# Patient Record
Sex: Male | Born: 1935 | Race: White | Hispanic: No | Marital: Married | Smoking: Never smoker
Health system: Southern US, Community
[De-identification: ages and names within clinical notes are randomized; demographics above are authoritative.]

## PROBLEM LIST (undated history)

## (undated) DIAGNOSIS — E079 Disorder of thyroid, unspecified: Secondary | ICD-10-CM

## (undated) DIAGNOSIS — E785 Hyperlipidemia, unspecified: Secondary | ICD-10-CM

## (undated) DIAGNOSIS — E119 Type 2 diabetes mellitus without complications: Secondary | ICD-10-CM

## (undated) HISTORY — DX: Type 2 diabetes mellitus without complications: E11.9

## (undated) HISTORY — DX: Hyperlipidemia, unspecified: E78.5

## (undated) HISTORY — DX: Disorder of thyroid, unspecified: E07.9

---

## 2005-05-20 ENCOUNTER — Ambulatory Visit: Payer: Self-pay | Admitting: Family Medicine

## 2005-06-12 ENCOUNTER — Ambulatory Visit: Payer: Self-pay | Admitting: Internal Medicine

## 2013-03-31 ENCOUNTER — Ambulatory Visit (INDEPENDENT_AMBULATORY_CARE_PROVIDER_SITE_OTHER): Payer: Medicare Other | Admitting: Family Medicine

## 2013-03-31 ENCOUNTER — Encounter: Payer: Self-pay | Admitting: Family Medicine

## 2013-03-31 VITALS — BP 152/76 | HR 70 | Temp 97.6°F | Ht 70.0 in | Wt 172.8 lb

## 2013-03-31 DIAGNOSIS — E119 Type 2 diabetes mellitus without complications: Secondary | ICD-10-CM

## 2013-03-31 DIAGNOSIS — E785 Hyperlipidemia, unspecified: Secondary | ICD-10-CM | POA: Insufficient documentation

## 2013-03-31 DIAGNOSIS — H9319 Tinnitus, unspecified ear: Secondary | ICD-10-CM | POA: Insufficient documentation

## 2013-03-31 DIAGNOSIS — H9313 Tinnitus, bilateral: Secondary | ICD-10-CM

## 2013-03-31 DIAGNOSIS — E039 Hypothyroidism, unspecified: Secondary | ICD-10-CM

## 2013-03-31 DIAGNOSIS — R350 Frequency of micturition: Secondary | ICD-10-CM

## 2013-03-31 DIAGNOSIS — N400 Enlarged prostate without lower urinary tract symptoms: Secondary | ICD-10-CM

## 2013-03-31 LAB — POCT URINALYSIS DIPSTICK
Bilirubin, UA: NEGATIVE
Blood, UA: NEGATIVE
Glucose, UA: NEGATIVE
Ketones, UA: NEGATIVE
Leukocytes, UA: NEGATIVE
Nitrite, UA: NEGATIVE
Protein, UA: NEGATIVE
Spec Grav, UA: 1.015
Urobilinogen, UA: NEGATIVE
pH, UA: 8

## 2013-03-31 MED ORDER — CIPROFLOXACIN HCL 500 MG PO TABS: 500.0000 mg | ORAL_TABLET | Freq: Two times a day (BID) | ORAL | Status: DC

## 2013-03-31 MED ORDER — TAMSULOSIN HCL 0.4 MG PO CAPS: 0.4000 mg | ORAL_CAPSULE | Freq: Every day | ORAL | Status: DC

## 2013-03-31 NOTE — Progress Notes (Signed)
  Subjective:    Patient ID: Arthur Franklin, male    DOB: 08/04/1932, 79 y.o.   MRN: [id number]  HPI This 79 y.o. male presents for evaluation of nocturia and urinary frequency. He states he has been Having to get up more at night to void.  He states he has been rx'd abx in the past and this helps. He has hx of BPH.  He was last seen in 2/14 and had labs then.  He denies fever.   Review of Systems C/o nocturia and tinnitus. No chest pain, SOB, HA, dizziness, vision change, N/V, diarrhea, constipation, myalgias, arthralgias or rash.     Objective:   Physical Exam Vital signs noted  Well developed well nourished male.  HEENT - Head atraumatic Normocephalic                Eyes - PERRLA, Conjuctiva - clear Sclera- Clear EOMI                Ears - EAC's Wnl TM's Wnl Gross Hearing WNL                Nose - Nares patent                 Throat - oropharanx wnl Respiratory - Lungs CTA bilateral Cardiac - RRR S1 and S2 without murmur GI - Abdomen soft Nontender and bowel sounds active x 4 Extremities - No edema. Neuro - Grossly intact.       Assessment & Plan:  Frequency of urination - Plan: POCT UA - Microscopic Only, POCT urinalysis dipstick, ciprofloxacin (CIPRO) 500 MG tablet, tamsulosin (FLOMAX) 0.4 MG CAPS capsule.  He may have some prostatitis so will tx with abx for 3 weeks.  BPH (benign prostatic hyperplasia) - Plan: ciprofloxacin (CIPRO) 500 MG tablet, tamsulosin (FLOMAX) 0.4 MG CAPS capsule  Tinnitus - Discussed that he needs to wear hearing protection if out around hazardous noise.  Follow up in 3 to 6 months.

## 2013-04-21 ENCOUNTER — Other Ambulatory Visit: Payer: Self-pay

## 2013-04-21 MED ORDER — METFORMIN HCL 500 MG PO TABS: 500.0000 mg | ORAL_TABLET | Freq: Every day | ORAL | Status: DC

## 2013-04-21 MED ORDER — LEVOTHYROXINE SODIUM 50 MCG PO TABS: 50.0000 ug | ORAL_TABLET | Freq: Every day | ORAL | Status: DC

## 2013-05-19 ENCOUNTER — Other Ambulatory Visit: Payer: Self-pay | Admitting: Family Medicine

## 2013-06-21 ENCOUNTER — Other Ambulatory Visit: Payer: Self-pay | Admitting: Family Medicine

## 2013-07-19 ENCOUNTER — Ambulatory Visit (INDEPENDENT_AMBULATORY_CARE_PROVIDER_SITE_OTHER): Payer: Medicare Other | Admitting: Family Medicine

## 2013-07-19 ENCOUNTER — Encounter: Payer: Self-pay | Admitting: Family Medicine

## 2013-07-19 VITALS — BP 139/69 | HR 73 | Temp 98.0°F | Ht 70.0 in | Wt 166.0 lb

## 2013-07-19 DIAGNOSIS — J209 Acute bronchitis, unspecified: Secondary | ICD-10-CM

## 2013-07-19 MED ORDER — BENZONATATE 100 MG PO CAPS: 100.0000 mg | ORAL_CAPSULE | Freq: Two times a day (BID) | ORAL | Status: DC | PRN

## 2013-07-19 MED ORDER — AZITHROMYCIN 250 MG PO TABS: ORAL_TABLET | ORAL | Status: DC

## 2013-07-19 NOTE — Progress Notes (Signed)
   Subjective:    Patient ID: Regnald Bowens, male    DOB: [birth year], 79 y.o.   MRN: [id number]  HPI   This 79 y.o. male presents for evaluation of URI sx's and cough for a week. He has also Been having constipation problems recently.. Review of Systems C/o URI sx's and constipation No chest pain, SOB, HA, dizziness, vision change, N/V, diarrhea, constipation, dysuria, urinary urgency or frequency, myalgias, arthralgias or rash.     Objective:   Physical Exam  Vital signs noted  Well developed well nourished male.  HEENT - Head atraumatic Normocephalic Ears - EAC's normal and TM's normal bilateral Respiratory - Lungs CTA bilateral Cardiac - RRR S1 and S2 without murmur GI - Abdomen soft Nontender and bowel sounds active x 4 Extremities - No edema. Neuro - Grossly intact.      Assessment & Plan:  Acute bronchitis - Plan: azithromycin (ZITHROMAX) 250 MG tablet, benzonatate (TESSALON) 100 MG capsule. Push po fluids, rest, tylenol and motrin otc prn as directed for fever, arthralgias, and myalgias.  Follow up prn if sx's continue or persist.  Constipation - Linzess 145 mcg po qd #28 samples  Deatra Canter FNP

## 2013-07-19 NOTE — Patient Instructions (Signed)

## 2013-08-17 ENCOUNTER — Other Ambulatory Visit: Payer: Self-pay | Admitting: Family Medicine

## 2013-08-22 NOTE — Telephone Encounter (Signed)
Last seen 07/19/13 B [place]  No glucose in EPIC

## 2013-09-20 ENCOUNTER — Other Ambulatory Visit: Payer: Self-pay | Admitting: Family Medicine

## 2013-10-03 ENCOUNTER — Ambulatory Visit: Payer: Medicare Other | Admitting: Family Medicine

## 2013-10-13 ENCOUNTER — Ambulatory Visit: Payer: Medicare Other | Admitting: Family Medicine

## 2014-01-23 ENCOUNTER — Encounter: Payer: Self-pay | Admitting: Family Medicine

## 2014-01-23 ENCOUNTER — Ambulatory Visit (INDEPENDENT_AMBULATORY_CARE_PROVIDER_SITE_OTHER): Payer: Medicare HMO | Admitting: Family Medicine

## 2014-01-23 VITALS — BP 137/73 | HR 59 | Temp 96.9°F | Ht 70.0 in | Wt 168.8 lb

## 2014-01-23 DIAGNOSIS — M549 Dorsalgia, unspecified: Secondary | ICD-10-CM

## 2014-01-23 DIAGNOSIS — N400 Enlarged prostate without lower urinary tract symptoms: Secondary | ICD-10-CM

## 2014-01-23 DIAGNOSIS — R350 Frequency of micturition: Secondary | ICD-10-CM

## 2014-01-23 LAB — POCT UA - MICROSCOPIC ONLY
Bacteria, U Microscopic: NEGATIVE
Casts, Ur, LPF, POC: NEGATIVE
Crystals, Ur, HPF, POC: NEGATIVE
Mucus, UA: NEGATIVE
Yeast, UA: NEGATIVE

## 2014-01-23 LAB — POCT URINALYSIS DIPSTICK
Bilirubin, UA: NEGATIVE
Blood, UA: NEGATIVE
Glucose, UA: NEGATIVE
Ketones, UA: NEGATIVE
Leukocytes, UA: NEGATIVE
Nitrite, UA: NEGATIVE
Spec Grav, UA: 1.01
Urobilinogen, UA: NEGATIVE
pH, UA: 7.5

## 2014-01-23 MED ORDER — CIPROFLOXACIN HCL 500 MG PO TABS: 500.0000 mg | ORAL_TABLET | Freq: Two times a day (BID) | ORAL | Status: DC

## 2014-01-23 NOTE — Progress Notes (Signed)
   Subjective:    Patient ID: Arthur Franklin, male    DOB: 04/18/1932, 80 y.o.   MRN: [id number]  HPI This 80 y.o. male presents for evaluation of urinary sx's and back discomfort.  He has hx of chronic prostatitis and states he feels like he does before getting on abx's and then the abx's help his urine flow.  He is on flomax.  He states he is having some left lumbar back discomfort.   Review of Systems C/o back pain and urinary sx's No chest pain, SOB, HA, dizziness, vision change, N/V, diarrhea, constipation,  myalgias, arthralgias or rash.     Objective:   Physical Exam Vital signs noted  Well developed well nourished male.  HEENT - Head atraumatic Normocephalic Respiratory - Lungs CTA bilateral Cardiac - RRR S1 and S2 without murmur GI - Abdomen soft Nontender and bowel sounds active x 4 Extremities - No edema. Neuro - Grossly intact. MS - TTP left LS muscle  Results for orders placed in visit on 01/23/14  POCT UA - MICROSCOPIC ONLY      Result Value Ref Range   WBC, Ur, HPF, POC 5-7     RBC, urine, microscopic 1-5     Bacteria, U Microscopic neg     Mucus, UA neg     Epithelial cells, urine per micros occ     Crystals, Ur, HPF, POC neg     Casts, Ur, LPF, POC neg     Yeast, UA neg    POCT URINALYSIS DIPSTICK      Result Value Ref Range   Color, UA gold     Clarity, UA clear     Glucose, UA neg     Bilirubin, UA neg     Ketones, UA neg     Spec Grav, UA 1.010     Blood, UA neg     pH, UA 7.5     Protein, UA trace     Urobilinogen, UA negative     Nitrite, UA neg     Leukocytes, UA Negative         Assessment & Plan:  Urinary frequency - Plan: POCT UA - Microscopic Only, POCT urinalysis dipstick, Urine culture  Back pain - Plan: Urine culture  Frequency of urination - Plan: ciprofloxacin (CIPRO) 500 MG tablet  BPH (benign prostatic hyperplasia) - Plan: ciprofloxacin (CIPRO) 500 MG tablet  Lysbeth Penner FNP

## 2014-01-25 LAB — URINE CULTURE

## 2014-06-06 ENCOUNTER — Ambulatory Visit (INDEPENDENT_AMBULATORY_CARE_PROVIDER_SITE_OTHER): Payer: Medicare HMO

## 2014-06-06 DIAGNOSIS — Z23 Encounter for immunization: Secondary | ICD-10-CM

## 2014-07-31 ENCOUNTER — Telehealth: Payer: Self-pay | Admitting: Family Medicine

## 2014-07-31 NOTE — Telephone Encounter (Signed)
Patient states he is going to go to the ED since he is in so much pain.

## 2014-09-30 ENCOUNTER — Other Ambulatory Visit: Payer: Self-pay | Admitting: Family Medicine

## 2014-10-31 ENCOUNTER — Other Ambulatory Visit: Payer: Self-pay | Admitting: Family Medicine

## 2014-11-21 ENCOUNTER — Ambulatory Visit (INDEPENDENT_AMBULATORY_CARE_PROVIDER_SITE_OTHER): Payer: Medicare HMO | Admitting: Family Medicine

## 2014-11-21 ENCOUNTER — Encounter: Payer: Self-pay | Admitting: Family Medicine

## 2014-11-21 VITALS — BP 154/75 | HR 65 | Temp 97.9°F | Ht 70.0 in | Wt 167.8 lb

## 2014-11-21 DIAGNOSIS — E038 Other specified hypothyroidism: Secondary | ICD-10-CM

## 2014-11-21 DIAGNOSIS — E119 Type 2 diabetes mellitus without complications: Secondary | ICD-10-CM | POA: Diagnosis not present

## 2014-11-21 DIAGNOSIS — N400 Enlarged prostate without lower urinary tract symptoms: Secondary | ICD-10-CM

## 2014-11-21 LAB — POCT GLYCOSYLATED HEMOGLOBIN (HGB A1C)

## 2014-11-21 NOTE — Progress Notes (Signed)
Subjective:  Patient ID: Arthur Franklin, male    DOB: February 19, 1932  Age: 81 y.o. MRN: [id number]  CC: Diabetes and Hypothyroidism   HPI Arthur Franklin presents for Follow-up of diabetes. Says he has no symptoms. Not on a diet. Avoids sweets. Patient does not check blood sugar at home. He questions the diagnosis of diabetes at this time. Patient denies symptoms such as polyuria, polydipsia, excessive hunger, nausea No significant hypoglycemic spells noted.  Medications as noted below. Taking them regularly without complication/adverse reaction being reported today.   Patient presents for follow-up on  thyroid. She has a history of hypothyroidism for many years. It has been stable recently. Pt. denies any change in  voice, loss of hair, heat or cold intolerance. Energy level has been adequate to good. She denies constipation and diarrhea. No myxedema. Medication is as noted below. Verified that pt is taking it daily on an empty stomach. Well tolerated.  Patient also in for follow-up of BPH. He is urinating frequently. His nocturia is occurring 2 times nightly. He is not taking medication for this problem. History Arthur Franklin has a past medical history of Diabetes mellitus without complication; Thyroid disease; and Hyperlipidemia.   He has no past surgical history on file.   His family history is not on file.He reports that he has never smoked. He does not have any smokeless tobacco history on file. His alcohol and drug histories are not on file.  Current Outpatient Prescriptions on File Prior to Visit  Medication Sig Dispense Refill  . levothyroxine (SYNTHROID, LEVOTHROID) 50 MCG tablet TAKE 1 TABLET DAILY BEFORE BREAKFAST 30 tablet 0  . metFORMIN (GLUCOPHAGE) 500 MG tablet TAKE (1) TABLET DAILY WITH BREAKFAST. 30 tablet 0   No current facility-administered medications on file prior to visit.    ROS Review of Systems  Constitutional: Negative for fever, chills, diaphoresis and unexpected weight  change.  HENT: Negative for congestion, hearing loss, rhinorrhea, sore throat and trouble swallowing.   Respiratory: Negative for cough, chest tightness, shortness of breath and wheezing.   Gastrointestinal: Negative for nausea, vomiting, abdominal pain, diarrhea, constipation and abdominal distention.  Endocrine: Negative for cold intolerance and heat intolerance.  Genitourinary: Negative for dysuria, hematuria and flank pain.  Musculoskeletal: Negative for joint swelling and arthralgias.  Skin: Negative for rash.  Neurological: Negative for dizziness and headaches.  Psychiatric/Behavioral: Negative for dysphoric mood, decreased concentration and agitation. The patient is not nervous/anxious.     Objective:  BP 154/75 mmHg  Pulse 65  Temp(Src) 97.9 F (36.6 C) (Oral)  Ht _0  (1.778 m)  Wt 167 lb 12.8 oz (76.114 kg)  BMI 24.08 kg/m2  BP Readings from Last 3 Encounters:  11/21/14 154/75  01/23/14 137/73  07/19/13 139/69    Wt Readings from Last 3 Encounters:  11/21/14 167 lb 12.8 oz (76.114 kg)  01/23/14 168 lb 12.8 oz (76.567 kg)  07/19/13 166 lb (75.297 kg)     Physical Exam  Constitutional: He is oriented to person, place, and time. He appears well-developed and well-nourished. No distress.  HENT:  Head: Normocephalic and atraumatic.  Right Ear: External ear normal.  Left Ear: External ear normal.  Nose: Nose normal.  Mouth/Throat: Oropharynx is clear and moist.  Eyes: Conjunctivae and EOM are normal. Pupils are equal, round, and reactive to light.  Neck: Normal range of motion. Neck supple. No thyromegaly present.  Cardiovascular: Normal rate, regular rhythm and normal heart sounds.   No murmur heard. Pulmonary/Chest: Effort normal and breath  sounds normal. No respiratory distress. He has no wheezes. He has no rales.  Abdominal: Soft. Bowel sounds are normal. He exhibits no distension. There is no tenderness.  Lymphadenopathy:    He has no cervical adenopathy.    Neurological: He is alert and oriented to person, place, and time. He has normal reflexes.  Skin: Skin is warm and dry.  Psychiatric: He has a normal mood and affect. His behavior is normal. Judgment and thought content normal.    Lab Results  Component Value Date   HGBA1C 5.7% 11/21/2014    Lab Results  Component Value Date   HGBA1C 5.7% 11/21/2014    No results found.  Assessment & Plan:   Arthur Franklin was seen today for diabetes and hypothyroidism.  Diagnoses and all orders for this visit:  Diabetes mellitus type 2, controlled, without complications Orders: -     POCT glycosylated hemoglobin (Hb A1C) -     CMP14+EGFR -     Lipid panel -     Cancel: POCT UA - Microalbumin -     CBC with Differential/Platelet  Other specified hypothyroidism Orders: -     Cancel: POCT CBC -     Thyroid Panel With TSH -     CBC with Differential/Platelet  BPH (benign prostatic hyperplasia) Orders: -     PSA, total and free -     CBC with Differential/Platelet   I have discontinued Arthur Franklin tamsulosin and ciprofloxacin. I am also having him maintain his metFORMIN and levothyroxine.  No orders of the defined types were placed in this encounter.     Follow-up: Return in about 3 months (around 02/20/2015). patient declined 3 month follow-up but was willing to follow-up in 6 months.  Claretta Fraise, M.D.

## 2014-11-22 LAB — CBC WITH DIFFERENTIAL/PLATELET
BASOS ABS: 0.1 10*3/uL (ref 0.0–0.2)
Basos: 1 %
Eos: 8 %
Eosinophils Absolute: 0.6 10*3/uL — ABNORMAL HIGH (ref 0.0–0.4)
HEMATOCRIT: 45.5 % (ref 37.5–51.0)
Hemoglobin: 15.1 g/dL (ref 12.6–17.7)
Immature Grans (Abs): 0 10*3/uL (ref 0.0–0.1)
Immature Granulocytes: 0 %
Lymphocytes Absolute: 2.3 10*3/uL (ref 0.7–3.1)
Lymphs: 30 %
MCH: 31.7 pg (ref 26.6–33.0)
MCHC: 33.2 g/dL (ref 31.5–35.7)
MCV: 95 fL (ref 79–97)
MONOCYTES: 9 %
MONOS ABS: 0.7 10*3/uL (ref 0.1–0.9)
Neutrophils Absolute: 3.9 10*3/uL (ref 1.4–7.0)
Neutrophils Relative %: 52 %
PLATELETS: 230 10*3/uL (ref 150–379)
RBC: 4.77 x10E6/uL (ref 4.14–5.80)
RDW: 13.9 % (ref 12.3–15.4)
WBC: 7.6 10*3/uL (ref 3.4–10.8)

## 2014-11-22 LAB — CMP14+EGFR
ALK PHOS: 81 IU/L (ref 39–117)
ALT: 11 IU/L (ref 0–44)
AST: 16 IU/L (ref 0–40)
Albumin/Globulin Ratio: 1.6 (ref 1.1–2.5)
Albumin: 4.2 g/dL (ref 3.5–4.7)
BILIRUBIN TOTAL: 0.3 mg/dL (ref 0.0–1.2)
BUN / CREAT RATIO: 15 (ref 10–22)
BUN: 14 mg/dL (ref 8–27)
CO2: 26 mmol/L (ref 18–29)
Calcium: 9.4 mg/dL (ref 8.6–10.2)
Chloride: 98 mmol/L (ref 97–108)
Creatinine, Ser: 0.92 mg/dL (ref 0.76–1.27)
GFR calc non Af Amer: 78 mL/min/{1.73_m2} (ref >59)
GFR, EST AFRICAN AMERICAN: 90 mL/min/{1.73_m2} (ref >59)
GLOBULIN, TOTAL: 2.6 g/dL (ref 1.5–4.5)
Glucose: 160 mg/dL — ABNORMAL HIGH (ref 65–99)
Potassium: 4.4 mmol/L (ref 3.5–5.2)
SODIUM: 138 mmol/L (ref 134–144)
Total Protein: 6.8 g/dL (ref 6.0–8.5)

## 2014-11-22 LAB — LIPID PANEL
Chol/HDL Ratio: 5.4 ratio units — ABNORMAL HIGH (ref 0.0–5.0)
Cholesterol, Total: 214 mg/dL — ABNORMAL HIGH (ref 100–199)
HDL: 40 mg/dL (ref >39)
LDL Calculated: 134 mg/dL — ABNORMAL HIGH (ref 0–99)
Triglycerides: 198 mg/dL — ABNORMAL HIGH (ref 0–149)
VLDL CHOLESTEROL CAL: 40 mg/dL (ref 5–40)

## 2014-11-22 LAB — PSA, TOTAL AND FREE
PSA FREE: 0.44 ng/mL
PSA, Free Pct: 33.8 %
PSA: 1.3 ng/mL (ref 0.0–4.0)

## 2014-11-22 LAB — THYROID PANEL WITH TSH
Free Thyroxine Index: 2.1 (ref 1.2–4.9)
T3 Uptake Ratio: 29 % (ref 24–39)
T4 TOTAL: 7.2 ug/dL (ref 4.5–12.0)
TSH: 3.96 u[IU]/mL (ref 0.450–4.500)

## 2014-11-23 ENCOUNTER — Other Ambulatory Visit: Payer: Self-pay | Admitting: Family Medicine

## 2014-11-23 MED ORDER — ATORVASTATIN CALCIUM 20 MG PO TABS: 20.0000 mg | ORAL_TABLET | Freq: Every day | ORAL | Status: DC

## 2014-11-24 MED ORDER — ATORVASTATIN CALCIUM 40 MG PO TABS: 40.0000 mg | ORAL_TABLET | Freq: Every day | ORAL | Status: DC

## 2014-11-24 NOTE — Addendum Note (Signed)
Addended by: Thana Ates on: 11/24/2014 08:52 AM   Modules accepted: Orders

## 2014-11-28 ENCOUNTER — Other Ambulatory Visit: Payer: Self-pay | Admitting: Family Medicine

## 2015-02-21 ENCOUNTER — Encounter: Payer: Self-pay | Admitting: Family Medicine

## 2015-02-21 ENCOUNTER — Encounter (INDEPENDENT_AMBULATORY_CARE_PROVIDER_SITE_OTHER): Payer: Self-pay

## 2015-02-21 ENCOUNTER — Ambulatory Visit (INDEPENDENT_AMBULATORY_CARE_PROVIDER_SITE_OTHER): Payer: Medicare HMO | Admitting: Family Medicine

## 2015-02-21 VITALS — BP 149/75 | HR 75 | Temp 97.5°F | Ht 70.0 in | Wt 166.0 lb

## 2015-02-21 DIAGNOSIS — E785 Hyperlipidemia, unspecified: Secondary | ICD-10-CM

## 2015-02-21 DIAGNOSIS — H9313 Tinnitus, bilateral: Secondary | ICD-10-CM | POA: Diagnosis not present

## 2015-02-21 DIAGNOSIS — E039 Hypothyroidism, unspecified: Secondary | ICD-10-CM | POA: Diagnosis not present

## 2015-02-21 DIAGNOSIS — E119 Type 2 diabetes mellitus without complications: Secondary | ICD-10-CM

## 2015-02-21 LAB — POCT GLYCOSYLATED HEMOGLOBIN (HGB A1C): HEMOGLOBIN A1C: 6.1

## 2015-02-21 MED ORDER — FEXOFENADINE-PSEUDOEPHED ER 60-120 MG PO TB12: 1.0000 | ORAL_TABLET | Freq: Two times a day (BID) | ORAL | Status: DC

## 2015-02-21 NOTE — Progress Notes (Signed)
Subjective:  Patient ID: Arthur Franklin, male    DOB: Oct 17, 1931  Age: 81 y.o. MRN: [id number]  CC: Diabetes; Hyperlipidemia; and Hypothyroidism   HPI Arthur Franklin presents for  follow-up of elevated cholesterol. Doing well without complaints on current medication. Denies side effects of statin including myalgia and arthralgia and nausea. Also in today for liver function testing. Currently no chest pain, shortness of breath or other cardiovascular related symptoms noted.  Follow-up of diabetes. Patient does not check blood sugar at home. Limits sweets. Patient denies symptoms such as polyuria, polydipsia, excessive hunger, nausea No significant hypoglycemic spells noted. Medications as noted below. Taking them regularly without complication/adverse reaction being reported today.   Patient presents for follow-up on  thyroid. Pt. has a history of hypothyroidism for many years. It has been stable recently. Pt. denies any change in  voice, loss of hair, heat or cold intolerance. Energy level has been adequate to good. She denies constipation and diarrhea. No myxedema. Medication is as noted below. Verified that pt is taking it daily on an empty stomach. Well tolerated.   History Arthur Franklin has a past medical history of Diabetes mellitus without complication; Thyroid disease; and Hyperlipidemia.   He has no past surgical history on file.   His family history is not on file.He reports that he has never smoked. He does not have any smokeless tobacco history on file. His alcohol and drug histories are not on file.  Current Outpatient Prescriptions on File Prior to Visit  Medication Sig Dispense Refill  . atorvastatin (LIPITOR) 40 MG tablet Take 1 tablet (40 mg total) by mouth daily. 90 tablet 3  . levothyroxine (SYNTHROID, LEVOTHROID) 50 MCG tablet TAKE 1 TABLET DAILY BEFORE BREAKFAST 30 tablet 3  . metFORMIN (GLUCOPHAGE) 500 MG tablet TAKE (1) TABLET DAILY WITH BREAKFAST. 30 tablet 3   No current  facility-administered medications on file prior to visit.    ROS Review of Systems  Constitutional: Negative for fever, chills and diaphoresis.  HENT: Positive for tinnitus (chronic, bilateral for many years. Two ENTs have told him he would have to live with it). Negative for congestion, ear discharge, ear pain, rhinorrhea and sore throat.   Respiratory: Negative for cough, shortness of breath and wheezing.   Cardiovascular: Negative for chest pain.  Gastrointestinal: Negative for nausea, vomiting, abdominal pain, diarrhea, constipation and abdominal distention.  Genitourinary: Negative for dysuria and frequency.  Musculoskeletal: Negative for joint swelling and arthralgias.  Skin: Negative for rash.  Neurological: Negative for headaches.    Objective:  BP 149/75 mmHg  Pulse 75  Temp(Src) 97.5 F (36.4 C) (Oral)  Ht '5\' 10"'  (1.778 m)  Wt 166 lb (75.297 kg)  BMI 23.82 kg/m2  BP Readings from Last 3 Encounters:  02/21/15 149/75  11/21/14 154/75  01/23/14 137/73    Wt Readings from Last 3 Encounters:  02/21/15 166 lb (75.297 kg)  11/21/14 167 lb 12.8 oz (76.114 kg)  01/23/14 168 lb 12.8 oz (76.567 kg)     Physical Exam  Constitutional: He is oriented to person, place, and time. He appears well-developed and well-nourished. No distress.  HENT:  Head: Normocephalic and atraumatic.  Right Ear: External ear normal.  Left Ear: External ear normal.  Nose: Nose normal.  Mouth/Throat: Oropharynx is clear and moist.  Eyes: Conjunctivae and EOM are normal. Pupils are equal, round, and reactive to light.  Neck: Normal range of motion. Neck supple. No thyromegaly present.  Cardiovascular: Normal rate, regular rhythm and normal heart sounds.  No murmur heard. Pulmonary/Chest: Effort normal and breath sounds normal. No respiratory distress. He has no wheezes. He has no rales.  Abdominal: Soft. Bowel sounds are normal. He exhibits no distension. There is no tenderness.    Lymphadenopathy:    He has no cervical adenopathy.  Neurological: He is alert and oriented to person, place, and time. He has normal reflexes.  Skin: Skin is warm and dry.  Psychiatric: He has a normal mood and affect. His behavior is normal. Judgment and thought content normal.    Lab Results  Component Value Date   HGBA1C 6.1 02/21/2015   HGBA1C 5.7% 11/21/2014    Lab Results  Component Value Date   WBC 7.6 11/21/2014   HGB 15.1 11/21/2014   HCT 45.5 11/21/2014   PLT 230 11/21/2014   GLUCOSE 160* 11/21/2014   CHOL 214* 11/21/2014   TRIG 198* 11/21/2014   HDL 40 11/21/2014   LDLCALC 134* 11/21/2014   ALT 11 11/21/2014   AST 16 11/21/2014   NA 138 11/21/2014   K 4.4 11/21/2014   CL 98 11/21/2014   CREATININE 0.92 11/21/2014   BUN 14 11/21/2014   CO2 26 11/21/2014   TSH 3.960 11/21/2014   PSA 1.3 11/21/2014   HGBA1C 6.1 02/21/2015    No results found.  Assessment & Plan:   Nature was seen today for diabetes, hyperlipidemia and hypothyroidism.  Diagnoses and all orders for this visit:  Hyperlipidemia Orders: -     CMP14+EGFR -     Lipid panel  Hypothyroidism, unspecified hypothyroidism type Orders: -     CMP14+EGFR -     TSH -     T4, free  Type 2 diabetes mellitus without complication Orders: -     POCT glycosylated hemoglobin (Hb A1C) -     CMP14+EGFR  Tinnitus, bilateral Orders: -     CMP14+EGFR  Other orders -     fexofenadine-pseudoephedrine (ALLEGRA-D ALLERGY & CONGESTION) 60-120 MG per tablet; Take 1 tablet by mouth 2 (two) times daily. For allergy and congestion in ears   I am having Mr. Bergen start on fexofenadine-pseudoephedrine. I am also having him maintain his atorvastatin, levothyroxine, and metFORMIN.  Meds ordered this encounter  Medications  . fexofenadine-pseudoephedrine (ALLEGRA-D ALLERGY & CONGESTION) 60-120 MG per tablet    Sig: Take 1 tablet by mouth 2 (two) times daily. For allergy and congestion in ears    Dispense:  60  tablet    Refill:  5     Follow-up: Return in about 6 months (around 08/24/2015) for diabetes, cholesterol.  Claretta Fraise, M.D.

## 2015-02-22 LAB — CMP14+EGFR
A/G RATIO: 1.8 (ref 1.1–2.5)
ALT: 24 IU/L (ref 0–44)
AST: 21 IU/L (ref 0–40)
Albumin: 4.2 g/dL (ref 3.5–4.7)
Alkaline Phosphatase: 72 IU/L (ref 39–117)
BILIRUBIN TOTAL: 0.5 mg/dL (ref 0.0–1.2)
BUN/Creatinine Ratio: 18 (ref 10–22)
BUN: 17 mg/dL (ref 8–27)
CHLORIDE: 101 mmol/L (ref 97–108)
CO2: 25 mmol/L (ref 18–29)
Calcium: 9.5 mg/dL (ref 8.6–10.2)
Creatinine, Ser: 0.96 mg/dL (ref 0.76–1.27)
GFR, EST AFRICAN AMERICAN: 86 mL/min/{1.73_m2} (ref >59)
GFR, EST NON AFRICAN AMERICAN: 74 mL/min/{1.73_m2} (ref >59)
GLUCOSE: 101 mg/dL — AB (ref 65–99)
Globulin, Total: 2.3 g/dL (ref 1.5–4.5)
Potassium: 4.5 mmol/L (ref 3.5–5.2)
Sodium: 141 mmol/L (ref 134–144)
TOTAL PROTEIN: 6.5 g/dL (ref 6.0–8.5)

## 2015-02-22 LAB — LIPID PANEL
CHOL/HDL RATIO: 2.7 ratio (ref 0.0–5.0)
Cholesterol, Total: 114 mg/dL (ref 100–199)
HDL: 43 mg/dL (ref >39)
LDL CALC: 56 mg/dL (ref 0–99)
Triglycerides: 73 mg/dL (ref 0–149)
VLDL Cholesterol Cal: 15 mg/dL (ref 5–40)

## 2015-02-22 LAB — TSH: TSH: 3.64 u[IU]/mL (ref 0.450–4.500)

## 2015-02-22 LAB — T4, FREE: Free T4: 1.17 ng/dL (ref 0.82–1.77)

## 2015-04-11 ENCOUNTER — Other Ambulatory Visit: Payer: Self-pay | Admitting: Family Medicine

## 2015-05-31 DIAGNOSIS — E039 Hypothyroidism, unspecified: Secondary | ICD-10-CM | POA: Diagnosis not present

## 2015-05-31 DIAGNOSIS — E785 Hyperlipidemia, unspecified: Secondary | ICD-10-CM | POA: Diagnosis not present

## 2015-05-31 DIAGNOSIS — E119 Type 2 diabetes mellitus without complications: Secondary | ICD-10-CM | POA: Diagnosis not present

## 2015-06-22 DIAGNOSIS — Z23 Encounter for immunization: Secondary | ICD-10-CM | POA: Diagnosis not present

## 2015-07-16 ENCOUNTER — Other Ambulatory Visit: Payer: Self-pay | Admitting: Family Medicine

## 2015-08-10 ENCOUNTER — Other Ambulatory Visit: Payer: Self-pay

## 2015-08-10 MED ORDER — NAPROXEN 375 MG PO TABS: ORAL_TABLET | ORAL | Status: DC

## 2015-08-10 NOTE — Telephone Encounter (Signed)
Last seen 02/21/15  Dr Livia Snellen  This med was not on EPIC list  Sent from pharmacy

## 2015-08-10 NOTE — Telephone Encounter (Signed)
Patient aware of script sent in and may need to follow up with Dr. Livia Snellen.

## 2015-08-27 ENCOUNTER — Encounter: Payer: Self-pay | Admitting: Family Medicine

## 2015-08-27 ENCOUNTER — Ambulatory Visit (INDEPENDENT_AMBULATORY_CARE_PROVIDER_SITE_OTHER): Payer: Medicare HMO | Admitting: Family Medicine

## 2015-08-27 VITALS — BP 137/66 | HR 70 | Temp 97.5°F | Ht 70.0 in | Wt 167.4 lb

## 2015-08-27 DIAGNOSIS — E039 Hypothyroidism, unspecified: Secondary | ICD-10-CM | POA: Diagnosis not present

## 2015-08-27 DIAGNOSIS — E785 Hyperlipidemia, unspecified: Secondary | ICD-10-CM | POA: Diagnosis not present

## 2015-08-27 DIAGNOSIS — E119 Type 2 diabetes mellitus without complications: Secondary | ICD-10-CM | POA: Diagnosis not present

## 2015-08-27 LAB — POCT GLYCOSYLATED HEMOGLOBIN (HGB A1C): HEMOGLOBIN A1C: 5.9

## 2015-08-27 MED ORDER — METFORMIN HCL 500 MG PO TABS: ORAL_TABLET | ORAL | Status: DC

## 2015-08-27 MED ORDER — LEVOTHYROXINE SODIUM 50 MCG PO TABS: 50.0000 ug | ORAL_TABLET | Freq: Every day | ORAL | Status: DC

## 2015-08-27 MED ORDER — ATORVASTATIN CALCIUM 40 MG PO TABS: 40.0000 mg | ORAL_TABLET | Freq: Every day | ORAL | Status: DC

## 2015-08-27 NOTE — Progress Notes (Signed)
Subjective:  Patient ID: Arthur Franklin, male    DOB: February 04, 1932  Age: 82 y.o. MRN: [id number]  CC: Diabetes; Hyperlipidemia; and Hypertension   HPI Arthur Franklin presents for  Patient  in for follow-up of elevated cholesterol. Doing well without complaints on current medication. Denies side effects of statin including myalgia and arthralgia and nausea. Also in today for liver function testing. Currently no chest pain, shortness of breath or other cardiovascular related symptoms noted.  Follow-up of diabetes. Patient does not check blood sugar at home Patient denies symptoms such as polyuria, polydipsia, excessive hunger, nausea No significant hypoglycemic spells noted. Medications as noted below. Taking them regularly without complication/adverse reaction being reported today.         History Arthur Franklin has a past medical history of Diabetes mellitus without complication ([place]); Thyroid disease; and Hyperlipidemia.   Arthur Franklin has no past surgical history on file.   His family history is not on file.Arthur Franklin reports that Arthur Franklin has never smoked. Arthur Franklin does not have any smokeless tobacco history on file. His alcohol and drug histories are not on file.  Current Outpatient Prescriptions on File Prior to Visit  Medication Sig Dispense Refill  . fexofenadine-pseudoephedrine (ALLEGRA-D ALLERGY & CONGESTION) 60-120 MG per tablet Take 1 tablet by mouth 2 (two) times daily. For allergy and congestion in ears 60 tablet 5  . naproxen (NAPROSYN) 375 MG tablet Take 1 tab twice daily as needed with meals 15 tablet 0   No current facility-administered medications on file prior to visit.    ROS Review of Systems  Constitutional: Negative for fever, chills, diaphoresis and unexpected weight change.  HENT: Negative for congestion, hearing loss, rhinorrhea and sore throat.   Eyes: Negative for visual disturbance.  Respiratory: Negative for cough and shortness of breath.   Cardiovascular: Negative for chest pain.    Gastrointestinal: Negative for abdominal pain, diarrhea and constipation.  Genitourinary: Negative for dysuria and flank pain.  Musculoskeletal: Negative for joint swelling and arthralgias.  Skin: Negative for rash.  Neurological: Negative for dizziness and headaches.  Psychiatric/Behavioral: Negative for sleep disturbance and dysphoric mood.    Objective:  BP 137/66 mmHg  Pulse 70  Temp(Src) 97.5 F (36.4 C) (Oral)  Ht 5' 10" (1.778 m)  Wt 167 lb 6.4 oz (75.932 kg)  BMI 24.02 kg/m2  SpO2 97%  BP Readings from Last 3 Encounters:  08/27/15 137/66  02/21/15 149/75  11/21/14 154/75    Wt Readings from Last 3 Encounters:  08/27/15 167 lb 6.4 oz (75.932 kg)  02/21/15 166 lb (75.297 kg)  11/21/14 167 lb 12.8 oz (76.114 kg)     Physical Exam  Constitutional: Arthur Franklin is oriented to person, place, and time. Arthur Franklin appears well-developed and well-nourished. No distress.  HENT:  Head: Normocephalic and atraumatic.  Right Ear: External ear normal.  Left Ear: External ear normal.  Nose: Nose normal.  Mouth/Throat: Oropharynx is clear and moist.  Eyes: Conjunctivae and EOM are normal. Pupils are equal, round, and reactive to light.  Neck: Normal range of motion. Neck supple. No thyromegaly present.  Cardiovascular: Normal rate, regular rhythm and normal heart sounds.   No murmur heard. Pulmonary/Chest: Effort normal and breath sounds normal. No respiratory distress. Arthur Franklin has no wheezes. Arthur Franklin has no rales.  Abdominal: Soft. Bowel sounds are normal. Arthur Franklin exhibits no distension. There is no tenderness.  Lymphadenopathy:    Arthur Franklin has no cervical adenopathy.  Neurological: Arthur Franklin is alert and oriented to person, place, and time. Arthur Franklin has normal reflexes.  Skin: Skin is warm and dry.  Psychiatric: Arthur Franklin has a normal mood and affect. His behavior is normal. Judgment and thought content normal.    Lab Results  Component Value Date   HGBA1C 5.9 08/27/2015   HGBA1C 6.1 02/21/2015   HGBA1C 5.7% 11/21/2014     Lab Results  Component Value Date   WBC 7.6 11/21/2014   HGB 15.1 11/21/2014   HCT 45.5 11/21/2014   PLT 230 11/21/2014   GLUCOSE 101* 02/21/2015   CHOL 114 02/21/2015   TRIG 73 02/21/2015   HDL 43 02/21/2015   LDLCALC 56 02/21/2015   ALT 24 02/21/2015   AST 21 02/21/2015   NA 141 02/21/2015   K 4.5 02/21/2015   CL 101 02/21/2015   CREATININE 0.96 02/21/2015   BUN 17 02/21/2015   CO2 25 02/21/2015   TSH 3.640 02/21/2015   PSA 1.3 11/21/2014   HGBA1C 5.9 08/27/2015    No results found.  Assessment & Plan:   Arthur Franklin was seen today for diabetes, hyperlipidemia and hypertension.  Diagnoses and all orders for this visit:  Controlled type 2 diabetes mellitus without complication, without long-term current use of insulin (HCC) -     POCT glycosylated hemoglobin (Hb A1C) -     CBC with Differential/Platelet -     CMP14+EGFR -     Microalbumin / creatinine urine ratio  Hyperlipemia -     CBC with Differential/Platelet -     CMP14+EGFR -     Lipid panel  Other orders -     atorvastatin (LIPITOR) 40 MG tablet; Take 1 tablet (40 mg total) by mouth daily. -     levothyroxine (SYNTHROID, LEVOTHROID) 50 MCG tablet; Take 1 tablet (50 mcg total) by mouth daily before breakfast. -     metFORMIN (GLUCOPHAGE) 500 MG tablet; TAKE (1) TABLET DAILY WITH BREAKFAST.   I have changed Arthur Franklin levothyroxine. I am also having him maintain his fexofenadine-pseudoephedrine, naproxen, atorvastatin, and metFORMIN.  Meds ordered this encounter  Medications  . atorvastatin (LIPITOR) 40 MG tablet    Sig: Take 1 tablet (40 mg total) by mouth daily.    Dispense:  90 tablet    Refill:  3  . levothyroxine (SYNTHROID, LEVOTHROID) 50 MCG tablet    Sig: Take 1 tablet (50 mcg total) by mouth daily before breakfast.    Dispense:  30 tablet    Refill:  10  . metFORMIN (GLUCOPHAGE) 500 MG tablet    Sig: TAKE (1) TABLET DAILY WITH BREAKFAST.    Dispense:  30 tablet    Refill:  1      Follow-up: Return in about 6 months (around 02/24/2016) for CPE.  Claretta Fraise, M.D.

## 2015-08-28 LAB — CBC WITH DIFFERENTIAL/PLATELET
BASOS: 1 %
Basophils Absolute: 0.1 10*3/uL (ref 0.0–0.2)
EOS (ABSOLUTE): 0.5 10*3/uL — ABNORMAL HIGH (ref 0.0–0.4)
EOS: 5 %
HEMATOCRIT: 42.4 % (ref 37.5–51.0)
HEMOGLOBIN: 14.4 g/dL (ref 12.6–17.7)
Immature Grans (Abs): 0 10*3/uL (ref 0.0–0.1)
Immature Granulocytes: 0 %
LYMPHS ABS: 1.7 10*3/uL (ref 0.7–3.1)
Lymphs: 21 %
MCH: 31.9 pg (ref 26.6–33.0)
MCHC: 34 g/dL (ref 31.5–35.7)
MCV: 94 fL (ref 79–97)
MONOCYTES: 11 %
Monocytes Absolute: 0.9 10*3/uL (ref 0.1–0.9)
NEUTROS ABS: 5.3 10*3/uL (ref 1.4–7.0)
Neutrophils: 62 %
Platelets: 276 10*3/uL (ref 150–379)
RBC: 4.52 x10E6/uL (ref 4.14–5.80)
RDW: 13.7 % (ref 12.3–15.4)
WBC: 8.4 10*3/uL (ref 3.4–10.8)

## 2015-08-28 LAB — MICROALBUMIN / CREATININE URINE RATIO
CREATININE, UR: 181.6 mg/dL
MICROALB/CREAT RATIO: 9.6 mg/g{creat} (ref 0.0–30.0)
MICROALBUM., U, RANDOM: 17.4 ug/mL

## 2015-08-28 LAB — CMP14+EGFR
ALBUMIN: 4.3 g/dL (ref 3.5–4.7)
ALK PHOS: 79 IU/L (ref 39–117)
ALT: 20 IU/L (ref 0–44)
AST: 25 IU/L (ref 0–40)
Albumin/Globulin Ratio: 2 (ref 1.1–2.5)
BILIRUBIN TOTAL: 0.4 mg/dL (ref 0.0–1.2)
BUN / CREAT RATIO: 14 (ref 10–22)
BUN: 15 mg/dL (ref 8–27)
CHLORIDE: 101 mmol/L (ref 96–106)
CO2: 25 mmol/L (ref 18–29)
CREATININE: 1.05 mg/dL (ref 0.76–1.27)
Calcium: 9.4 mg/dL (ref 8.6–10.2)
GFR calc non Af Amer: 66 mL/min/{1.73_m2} (ref >59)
GFR, EST AFRICAN AMERICAN: 77 mL/min/{1.73_m2} (ref >59)
GLOBULIN, TOTAL: 2.2 g/dL (ref 1.5–4.5)
Glucose: 128 mg/dL — ABNORMAL HIGH (ref 65–99)
Potassium: 4.4 mmol/L (ref 3.5–5.2)
SODIUM: 140 mmol/L (ref 134–144)
TOTAL PROTEIN: 6.5 g/dL (ref 6.0–8.5)

## 2015-08-28 LAB — LIPID PANEL
CHOLESTEROL TOTAL: 123 mg/dL (ref 100–199)
Chol/HDL Ratio: 2.9 ratio units (ref 0.0–5.0)
HDL: 43 mg/dL (ref >39)
LDL CALC: 65 mg/dL (ref 0–99)
Triglycerides: 76 mg/dL (ref 0–149)
VLDL Cholesterol Cal: 15 mg/dL (ref 5–40)

## 2015-11-10 ENCOUNTER — Other Ambulatory Visit: Payer: Self-pay | Admitting: Family Medicine

## 2015-11-15 ENCOUNTER — Ambulatory Visit (INDEPENDENT_AMBULATORY_CARE_PROVIDER_SITE_OTHER): Payer: Medicare HMO | Admitting: Family Medicine

## 2015-11-15 ENCOUNTER — Encounter: Payer: Self-pay | Admitting: Family Medicine

## 2015-11-15 VITALS — BP 122/75 | HR 59 | Temp 97.4°F | Ht 70.0 in | Wt 161.0 lb

## 2015-11-15 DIAGNOSIS — J069 Acute upper respiratory infection, unspecified: Secondary | ICD-10-CM

## 2015-11-15 NOTE — Progress Notes (Signed)
BP 122/75 mmHg  Pulse 59  Temp(Src) 97.4 F (36.3 C) (Oral)  Ht 5\' 10"  (1.778 m)  Wt 161 lb (73.029 kg)  BMI 23.10 kg/m2   Subjective:    Patient ID: Arthur Franklin, male    DOB: [birth year], 82 y.o.   MRN: [id number]  HPI: Arthur Franklin is a 82 y.o. male presenting on 11/15/2015 for Chest congestion, cough   HPI Chest congestion and cough Patient has been having chest congestion and cough for 4 days. He woke up today and does not have it anymore. His cough is resolved his congestion is resolved he doesn't have any ear pressure or sore throat or fevers or chills or shortness of breath or wheezing.  Relevant past medical, surgical, family and social history reviewed and updated as indicated. Interim medical history since our last visit reviewed. Allergies and medications reviewed and updated.  Review of Systems  Constitutional: Negative for fever and chills.  HENT: Negative for congestion, ear discharge, ear pain, rhinorrhea, sinus pressure, sneezing and sore throat.   Eyes: Negative for discharge and visual disturbance.  Respiratory: Positive for cough. Negative for chest tightness, shortness of breath and wheezing.   Cardiovascular: Negative for chest pain and leg swelling.  Gastrointestinal: Negative for abdominal pain, diarrhea and constipation.  Genitourinary: Negative for difficulty urinating.  Musculoskeletal: Negative for back pain and gait problem.  Skin: Negative for color change and rash.  Neurological: Negative for dizziness, syncope, light-headedness and headaches.  All other systems reviewed and are negative.   Per HPI unless specifically indicated above     Medication List       This list is accurate as of: 11/15/15  2:19 PM.  Always use your most recent med list.               atorvastatin 40 MG tablet  Commonly known as:  LIPITOR  Take 1 tablet (40 mg total) by mouth daily.     levothyroxine 50 MCG tablet  Commonly known as:  SYNTHROID, LEVOTHROID    Take 1 tablet (50 mcg total) by mouth daily before breakfast.     metFORMIN 500 MG tablet  Commonly known as:  GLUCOPHAGE  TAKE ONE TABLET BY MOUTH ONCE DAILY WITH BREAKFAST           Objective:    BP 122/75 mmHg  Pulse 59  Temp(Src) 97.4 F (36.3 C) (Oral)  Ht 5\' 10"  (1.778 m)  Wt 161 lb (73.029 kg)  BMI 23.10 kg/m2  Wt Readings from Last 3 Encounters:  11/15/15 161 lb (73.029 kg)  08/27/15 167 lb 6.4 oz (75.932 kg)  02/21/15 166 lb (75.297 kg)    Physical Exam  Constitutional: He is oriented to person, place, and time. He appears well-developed and well-nourished. No distress.  HENT:  Right Ear: External ear normal.  Left Ear: External ear normal.  Nose: Nose normal.  Mouth/Throat: Oropharynx is clear and moist. No oropharyngeal exudate.  Eyes: Conjunctivae and EOM are normal. Pupils are equal, round, and reactive to light. Right eye exhibits no discharge. No scleral icterus.  Neck: Neck supple. No thyromegaly present.  Cardiovascular: Normal rate, regular rhythm, normal heart sounds and intact distal pulses.   No murmur heard. Pulmonary/Chest: Effort normal and breath sounds normal. No respiratory distress. He has no wheezes.  Musculoskeletal: Normal range of motion. He exhibits no edema.  Lymphadenopathy:    He has no cervical adenopathy.  Neurological: He is alert and oriented to person, place, and time. Coordination  normal.  Skin: Skin is warm and dry. No rash noted. He is not diaphoretic.  Psychiatric: He has a normal mood and affect. His behavior is normal. Judgment and thought content normal.  Vitals reviewed.     Assessment & Plan:   Problem List Items Addressed This Visit    None    Visit Diagnoses    Viral upper respiratory infection    -  Primary    Symptoms resolved now, continue supportive care       Follow up plan: Return if symptoms worsen or fail to improve.  Counseling provided for all of the vaccine components No orders of the defined  types were placed in this encounter.    Caryl Pina, MD [facility] 11/15/2015, 2:19 PM

## 2015-12-21 ENCOUNTER — Encounter: Payer: Self-pay | Admitting: Family Medicine

## 2015-12-21 ENCOUNTER — Ambulatory Visit (INDEPENDENT_AMBULATORY_CARE_PROVIDER_SITE_OTHER): Payer: Medicare HMO | Admitting: Family Medicine

## 2015-12-21 ENCOUNTER — Encounter (INDEPENDENT_AMBULATORY_CARE_PROVIDER_SITE_OTHER): Payer: Self-pay

## 2015-12-21 VITALS — BP 138/67 | HR 64 | Temp 96.8°F | Ht 70.0 in | Wt 161.4 lb

## 2015-12-21 DIAGNOSIS — E785 Hyperlipidemia, unspecified: Secondary | ICD-10-CM | POA: Diagnosis not present

## 2015-12-21 DIAGNOSIS — Z1211 Encounter for screening for malignant neoplasm of colon: Secondary | ICD-10-CM | POA: Diagnosis not present

## 2015-12-21 DIAGNOSIS — Z0000 Encounter for general adult medical examination without abnormal findings: Secondary | ICD-10-CM

## 2015-12-21 DIAGNOSIS — E119 Type 2 diabetes mellitus without complications: Secondary | ICD-10-CM | POA: Diagnosis not present

## 2015-12-21 DIAGNOSIS — H9313 Tinnitus, bilateral: Secondary | ICD-10-CM | POA: Diagnosis not present

## 2015-12-21 DIAGNOSIS — E039 Hypothyroidism, unspecified: Secondary | ICD-10-CM | POA: Diagnosis not present

## 2015-12-21 DIAGNOSIS — Z125 Encounter for screening for malignant neoplasm of prostate: Secondary | ICD-10-CM | POA: Diagnosis not present

## 2015-12-21 LAB — BAYER DCA HB A1C WAIVED: HB A1C: 6 % (ref <7.0)

## 2015-12-21 NOTE — Progress Notes (Signed)
Subjective:   Arthur Franklin is a 82 y.o. male who presents for an Initial Medicare Annual Wellness Visit. " I feel good all the time. " Review of Systems  Review of Systems  Constitutional: Negative for fever, chills, weight loss, malaise/fatigue and diaphoresis.  HENT: Negative for congestion, ear pain, hearing loss, nosebleeds, sore throat and tinnitus.   Eyes: Negative for blurred vision, double vision, photophobia, pain, discharge and redness.  Respiratory: Negative for cough, hemoptysis, sputum production, shortness of breath and wheezing.   Cardiovascular: Negative for chest pain, palpitations, orthopnea, leg swelling and PND.  Gastrointestinal: Negative for heartburn, nausea, vomiting, abdominal pain, diarrhea, constipation, blood in stool and melena.  Genitourinary: Negative for dysuria, urgency, frequency, hematuria and flank pain.  Musculoskeletal: Positive for back pain (a little if I do too much). Negative for myalgias, joint pain, falls and neck pain.  Skin: Negative for itching and rash.  Neurological: Negative for dizziness, tingling, tremors, sensory change, speech change, focal weakness, seizures, loss of consciousness, weakness and headaches.  Endo/Heme/Allergies: Negative for environmental allergies and polydipsia. Does not bruise/bleed easily.  Psychiatric/Behavioral: Negative for depression, suicidal ideas, hallucinations, memory loss and substance abuse. The patient is not nervous/anxious and does not have insomnia.         Current Medications (verified) Outpatient Encounter Prescriptions as of 12/21/2015  Medication Sig  . atorvastatin (LIPITOR) 40 MG tablet Take 1 tablet (40 mg total) by mouth daily.  Marland Kitchen levothyroxine (SYNTHROID, LEVOTHROID) 50 MCG tablet Take 1 tablet (50 mcg total) by mouth daily before breakfast.  . metFORMIN (GLUCOPHAGE) 500 MG tablet TAKE ONE TABLET BY MOUTH ONCE DAILY WITH BREAKFAST   No facility-administered encounter medications on file as  of 12/21/2015.    Allergies (verified) Review of patient's allergies indicates no known allergies.   History: Past Medical History  Diagnosis Date  . Diabetes mellitus without complication ([place])   . Thyroid disease   . Hyperlipidemia    No past surgical history on file. No family history on file. Social History   Occupational History  . Not on file.   Social History Main Topics  . Smoking status: Never Smoker   . Smokeless tobacco: Not on file  . Alcohol Use: No  . Drug Use: No  . Sexual Activity: Not on file    Do you feel safe at home?  Yes Are there smokers in your home (other than you)? No  Dietary issues and exercise activities discussed: Current Exercise Habits: The patient does not participate in regular exercise at present Staying active caring for rental homes, sawing up a tree most recently  Current Dietary habits:  Eggs, toast at breakfast. Occasional meat. Usally veggies at supper, with cornbread      Objective:    Today's Vitals   12/21/15 1352  BP: 138/67  Pulse: 64  Temp: 96.8 F (36 C)  TempSrc: Oral  Height: 5\' 10"  (1.778 m)  Weight: 161 lb 6.4 oz (73.211 kg)  SpO2: 99%   Body mass index is 23.16 kg/(m^2).   Activities of Daily Living In your present state of health, do you have any difficulty performing the following activities: 12/21/2015  Hearing? N  Vision? N  Difficulty concentrating or making decisions? N  Walking or climbing stairs? N  Dressing or bathing? N  Doing errands, shopping? N     Depression Screen PHQ 2/9 Scores 12/21/2015 11/15/2015 08/27/2015 02/21/2015  PHQ - 2 Score 0 0 0 0     Fall Risk Fall Risk  12/21/2015 11/15/2015 08/27/2015 02/21/2015 01/23/2014  Falls in the past year? No No No No No    Cognitive Function: No flowsheet data found.  Immunizations and Health Maintenance Immunization History  Administered Date(s) Administered  . Influenza Split 06/17/2013  . Influenza, High Dose Seasonal PF 06/22/2015  .  Influenza,inj,Quad PF,36+ Mos 06/06/2014   Health Maintenance Due  Topic Date Due  . FOOT EXAM  05/26/1944  . OPHTHALMOLOGY EXAM  05/26/1944  . TETANUS/TDAP  05/26/1953  . PNA vac Low Risk Adult (1 of 2 - PCV13) 05/27/1999    Patient Care Team: Claretta Fraise, MD as PCP - General (Family Medicine)  Indicate any recent Medical Services you may have received from other than [facility] providers in the past year (date may be approximate).    Assessment:    Annual Wellness Visit    Screening Tests Health Maintenance  Topic Date Due  . FOOT EXAM  05/26/1944  . OPHTHALMOLOGY EXAM  05/26/1944  . TETANUS/TDAP  05/26/1953  . PNA vac Low Risk Adult (1 of 2 - PCV13) 05/27/1999  . ZOSTAVAX  03/22/2016 (Originally 05/26/1994)  . HEMOGLOBIN A1C  02/24/2016  . INFLUENZA VACCINE  03/18/2016  . URINE MICROALBUMIN  08/26/2016        Plan:   During the course of the visit Arthur Franklin was educated and counseled about the following appropriate screening and preventive services:   Vaccines to include Pneumoccal, Influenza,  Td, Zostavax,  Colorectal cancer screening  Cardiovascular disease screening  Diabetes screening  Bone Denisty / Osteoporosis Screening  Glaucoma screening / Diabetic Eye Exam  Nutrition counseling  Prostate cancer screening  Smoking cessation counseling  Advanced Directives  Physical Activity   Goals    None       Patient Instructions (the written plan) were given to the patient.   Claretta Fraise, MD   12/21/2015

## 2015-12-22 LAB — LIPID PANEL
CHOLESTEROL TOTAL: 111 mg/dL (ref 100–199)
Chol/HDL Ratio: 2.4 ratio units (ref 0.0–5.0)
HDL: 47 mg/dL (ref >39)
LDL Calculated: 52 mg/dL (ref 0–99)
Triglycerides: 62 mg/dL (ref 0–149)
VLDL Cholesterol Cal: 12 mg/dL (ref 5–40)

## 2015-12-22 LAB — TSH+FREE T4
FREE T4: 1.26 ng/dL (ref 0.82–1.77)
TSH: 3.01 u[IU]/mL (ref 0.450–4.500)

## 2015-12-22 LAB — PSA TOTAL (REFLEX TO FREE): Prostate Specific Ag, Serum: 2 ng/mL (ref 0.0–4.0)

## 2015-12-22 LAB — CMP14+EGFR
ALK PHOS: 75 IU/L (ref 39–117)
ALT: 22 IU/L (ref 0–44)
AST: 20 IU/L (ref 0–40)
Albumin/Globulin Ratio: 1.8 (ref 1.2–2.2)
Albumin: 4.3 g/dL (ref 3.5–4.7)
BILIRUBIN TOTAL: 0.5 mg/dL (ref 0.0–1.2)
BUN / CREAT RATIO: 13 (ref 10–24)
BUN: 13 mg/dL (ref 8–27)
CHLORIDE: 102 mmol/L (ref 96–106)
CO2: 22 mmol/L (ref 18–29)
Calcium: 8.9 mg/dL (ref 8.6–10.2)
Creatinine, Ser: 0.99 mg/dL (ref 0.76–1.27)
GFR, EST AFRICAN AMERICAN: 82 mL/min/{1.73_m2} (ref >59)
GFR, EST NON AFRICAN AMERICAN: 71 mL/min/{1.73_m2} (ref >59)
Globulin, Total: 2.4 g/dL (ref 1.5–4.5)
Glucose: 99 mg/dL (ref 65–99)
POTASSIUM: 4.2 mmol/L (ref 3.5–5.2)
SODIUM: 143 mmol/L (ref 134–144)
TOTAL PROTEIN: 6.7 g/dL (ref 6.0–8.5)

## 2015-12-22 LAB — CBC WITH DIFFERENTIAL/PLATELET
BASOS ABS: 0.1 10*3/uL (ref 0.0–0.2)
Basos: 1 %
EOS (ABSOLUTE): 0.4 10*3/uL (ref 0.0–0.4)
Eos: 4 %
Hematocrit: 43.2 % (ref 37.5–51.0)
Hemoglobin: 14.1 g/dL (ref 12.6–17.7)
IMMATURE GRANS (ABS): 0 10*3/uL (ref 0.0–0.1)
Immature Granulocytes: 0 %
LYMPHS: 36 %
Lymphocytes Absolute: 2.9 10*3/uL (ref 0.7–3.1)
MCH: 31.5 pg (ref 26.6–33.0)
MCHC: 32.6 g/dL (ref 31.5–35.7)
MCV: 97 fL (ref 79–97)
Monocytes Absolute: 0.8 10*3/uL (ref 0.1–0.9)
Monocytes: 10 %
NEUTROS ABS: 4 10*3/uL (ref 1.4–7.0)
Neutrophils: 49 %
PLATELETS: 214 10*3/uL (ref 150–379)
RBC: 4.47 x10E6/uL (ref 4.14–5.80)
RDW: 14.2 % (ref 12.3–15.4)
WBC: 8.1 10*3/uL (ref 3.4–10.8)

## 2015-12-23 LAB — FECAL OCCULT BLOOD, IMMUNOCHEMICAL: Fecal Occult Bld: NEGATIVE

## 2016-01-02 ENCOUNTER — Encounter: Payer: Self-pay | Admitting: *Deleted

## 2016-03-11 ENCOUNTER — Other Ambulatory Visit: Payer: Self-pay | Admitting: Family Medicine

## 2016-03-21 ENCOUNTER — Encounter: Payer: Self-pay | Admitting: Family Medicine

## 2016-03-21 ENCOUNTER — Ambulatory Visit (INDEPENDENT_AMBULATORY_CARE_PROVIDER_SITE_OTHER): Payer: Medicare HMO | Admitting: Family Medicine

## 2016-03-21 VITALS — BP 135/70 | HR 68 | Temp 98.3°F | Ht 70.0 in | Wt 162.4 lb

## 2016-03-21 DIAGNOSIS — E785 Hyperlipidemia, unspecified: Secondary | ICD-10-CM | POA: Diagnosis not present

## 2016-03-21 DIAGNOSIS — E039 Hypothyroidism, unspecified: Secondary | ICD-10-CM | POA: Diagnosis not present

## 2016-03-21 DIAGNOSIS — E119 Type 2 diabetes mellitus without complications: Secondary | ICD-10-CM

## 2016-03-21 LAB — BAYER DCA HB A1C WAIVED: HB A1C: 6.2 % (ref <7.0)

## 2016-03-21 NOTE — Progress Notes (Signed)
Subjective:  Patient ID: Arthur Franklin, male    DOB: [birth year]  Age: 82 y.o. MRN: [id number]  CC: Diabetes (3 mth rck); Hyperlipidemia; and Hypothyroidism   HPI Arthur Franklin presents for  follow-up of hypertension. Patient has no history of headache chest pain or shortness of breath or recent cough. Patient also denies symptoms of TIA such as numbness weakness lateralizing. Patient checks  blood pressure at home and has not had any elevated readings recently. Patient denies side effects from his medication. States taking it regularly.  Patient also  in for follow-up of elevated cholesterol. Doing well without complaints on current medication. Denies side effects of statin including myalgia and arthralgia and nausea. Also in today for liver function testing. Currently no chest pain, shortness of breath or other cardiovascular related symptoms noted.  Follow-up of diabetes. Patient does not check blood sugar at home.  Patient denies symptoms such as polyuria, polydipsia, excessive hunger, nausea No significant hypoglycemic spells noted. Medications as noted below. Taking them regularly without complication/adverse reaction being reported today.    History Arthur Franklin has a past medical history of Diabetes mellitus without complication ([place]); Hyperlipidemia; and Thyroid disease.   He has no past surgical history on file.   His family history is not on file.He reports that he has never smoked. He does not have any smokeless tobacco history on file. He reports that he does not drink alcohol or use drugs.  Current Outpatient Prescriptions on File Prior to Visit  Medication Sig Dispense Refill  . atorvastatin (LIPITOR) 40 MG tablet Take 1 tablet (40 mg total) by mouth daily. 90 tablet 3  . levothyroxine (SYNTHROID, LEVOTHROID) 50 MCG tablet Take 1 tablet (50 mcg total) by mouth daily before breakfast. 30 tablet 10  . metFORMIN (GLUCOPHAGE) 500 MG tablet TAKE ONE TABLET BY MOUTH ONCE DAILY WITH  BREAKFAST 30 tablet 1   No current facility-administered medications on file prior to visit.     ROS Review of Systems  Constitutional: Negative for chills, diaphoresis and fever.  HENT: Negative for rhinorrhea and sore throat.   Respiratory: Negative for cough and shortness of breath.   Cardiovascular: Negative for chest pain.  Gastrointestinal: Negative for abdominal pain.  Endocrine: Negative for cold intolerance, heat intolerance, polydipsia, polyphagia and polyuria.  Musculoskeletal: Negative for arthralgias and myalgias.  Skin: Negative for rash.  Neurological: Negative for weakness and headaches.    Objective:  BP 135/70 (BP Location: Left Arm, Patient Position: Sitting, Cuff Size: Normal)   Pulse 68   Temp 98.3 F (36.8 C) (Oral)   Ht 5' 10" (1.778 m)   Wt 162 lb 6.4 oz (73.7 kg)   SpO2 97%   BMI 23.30 kg/m   BP Readings from Last 3 Encounters:  03/21/16 135/70  12/21/15 138/67  11/15/15 122/75    Wt Readings from Last 3 Encounters:  03/21/16 162 lb 6.4 oz (73.7 kg)  12/21/15 161 lb 6.4 oz (73.2 kg)  11/15/15 161 lb (73 kg)     Physical Exam  Lab Results  Component Value Date   HGBA1C 5.9 08/27/2015   HGBA1C 6.1 02/21/2015   HGBA1C 5.7% 11/21/2014    Lab Results  Component Value Date   WBC 8.1 12/21/2015   HGB 15.1 11/21/2014   HCT 43.2 12/21/2015   PLT 214 12/21/2015   GLUCOSE 99 12/21/2015   CHOL 111 12/21/2015   TRIG 62 12/21/2015   HDL 47 12/21/2015   LDLCALC 52 12/21/2015   ALT 22 12/21/2015  AST 20 12/21/2015   NA 143 12/21/2015   K 4.2 12/21/2015   CL 102 12/21/2015   CREATININE 0.99 12/21/2015   BUN 13 12/21/2015   CO2 22 12/21/2015   TSH 3.010 12/21/2015   PSA 1.3 11/21/2014   HGBA1C 5.9 08/27/2015    No results found.  Assessment & Plan:   Arthur Franklin was seen today for diabetes, hyperlipidemia and hypothyroidism.  Diagnoses and all orders for this visit:  Type 2 diabetes mellitus without complication, without long-term  current use of insulin ([place]) -     Bayer DCA Hb A1c Waived -     CMP14+EGFR  Hypothyroidism, unspecified hypothyroidism type -     CMP14+EGFR  Hyperlipidemia -     CMP14+EGFR   I am having Arthur Franklin maintain his atorvastatin, levothyroxine, and metFORMIN.  No orders of the defined types were placed in this encounter.    Follow-up: Return in about 3 months (around 06/21/2016) for diabetes, cholesterol.  Arthur Franklin, M.D.

## 2016-03-22 LAB — CMP14+EGFR
A/G RATIO: 1.7 (ref 1.2–2.2)
ALT: 27 IU/L (ref 0–44)
AST: 24 IU/L (ref 0–40)
Albumin: 4.3 g/dL (ref 3.5–4.7)
Alkaline Phosphatase: 72 IU/L (ref 39–117)
BUN/Creatinine Ratio: 20 (ref 10–24)
BUN: 18 mg/dL (ref 8–27)
Bilirubin Total: 0.5 mg/dL (ref 0.0–1.2)
CALCIUM: 9.6 mg/dL (ref 8.6–10.2)
CO2: 24 mmol/L (ref 18–29)
CREATININE: 0.92 mg/dL (ref 0.76–1.27)
Chloride: 103 mmol/L (ref 96–106)
GFR, EST AFRICAN AMERICAN: 90 mL/min/{1.73_m2} (ref >59)
GFR, EST NON AFRICAN AMERICAN: 78 mL/min/{1.73_m2} (ref >59)
Globulin, Total: 2.6 g/dL (ref 1.5–4.5)
Glucose: 105 mg/dL — ABNORMAL HIGH (ref 65–99)
Potassium: 4.8 mmol/L (ref 3.5–5.2)
Sodium: 141 mmol/L (ref 134–144)
TOTAL PROTEIN: 6.9 g/dL (ref 6.0–8.5)

## 2016-03-24 ENCOUNTER — Ambulatory Visit: Payer: Medicare HMO | Admitting: Family Medicine

## 2016-04-22 ENCOUNTER — Encounter: Payer: Self-pay | Admitting: Family Medicine

## 2016-04-22 ENCOUNTER — Ambulatory Visit (INDEPENDENT_AMBULATORY_CARE_PROVIDER_SITE_OTHER): Payer: Medicare HMO | Admitting: Family Medicine

## 2016-04-22 ENCOUNTER — Ambulatory Visit (INDEPENDENT_AMBULATORY_CARE_PROVIDER_SITE_OTHER): Payer: Medicare HMO

## 2016-04-22 VITALS — BP 128/69 | HR 61 | Temp 97.0°F | Ht 70.0 in | Wt 161.1 lb

## 2016-04-22 DIAGNOSIS — M79645 Pain in left finger(s): Secondary | ICD-10-CM | POA: Diagnosis not present

## 2016-04-22 MED ORDER — DICLOFENAC SODIUM 75 MG PO TBEC: 75.0000 mg | DELAYED_RELEASE_TABLET | Freq: Two times a day (BID) | ORAL | 2 refills | Status: DC

## 2016-04-22 NOTE — Progress Notes (Signed)
Subjective:  Patient ID: Arthur Franklin, male    DOB: 10-Aug-1932  Age: 82 y.o. MRN: [id number]  CC: Joint Pain (pt woke up Friday morning with swollen left thumb, tender and hot to the touch. He is concerned it is Gout.)   HPI Arthur Franklin presents forOnset 4 days ago of moderately severe and increasing pain in the left thumb primarily at the base. It hurts for movement. It hurts to touch. It feels hot to him. He looked up his symptoms and a medical book and they said it was gout. Patient denies having had gout attacks in the past. He's not had many other joint pains recently. There has been no fever no chills no sweats. No known injury.   History Arthur Franklin has a past medical history of Diabetes mellitus without complication ([place]); Hyperlipidemia; and Thyroid disease.   He has no past surgical history on file.   His family history is not on file.He reports that he has never smoked. He has never used smokeless tobacco. He reports that he does not drink alcohol or use drugs.    ROS Review of Systems  Constitutional: Negative for chills, diaphoresis and fever.  HENT: Negative for rhinorrhea and sore throat.   Respiratory: Negative for cough and shortness of breath.   Cardiovascular: Negative for chest pain.  Gastrointestinal: Negative for abdominal pain.  Musculoskeletal: Negative for myalgias.  Skin: Negative for rash.  Neurological: Negative for weakness and headaches.    Objective:  BP 128/69   Pulse 61   Temp 97 F (36.1 C) (Oral)   Ht 5\' 10"  (1.778 m)   Wt 161 lb 2 oz (73.1 kg)   BMI 23.12 kg/m   BP Readings from Last 3 Encounters:  04/22/16 128/69  03/21/16 135/70  12/21/15 138/67    Wt Readings from Last 3 Encounters:  04/22/16 161 lb 2 oz (73.1 kg)  03/21/16 162 lb 6.4 oz (73.7 kg)  12/21/15 161 lb 6.4 oz (73.2 kg)     Physical Exam  Constitutional: He is oriented to person, place, and time. He appears well-developed and well-nourished.  HENT:  Head:  Normocephalic and atraumatic.  Right Ear: External ear normal.  Left Ear: External ear normal.  Mouth/Throat: No oropharyngeal exudate or posterior oropharyngeal erythema.  Eyes: Pupils are equal, round, and reactive to light.  Neck: Normal range of motion. Neck supple.  Cardiovascular: Normal rate and regular rhythm.   No murmur heard. Pulmonary/Chest: Breath sounds normal. No respiratory distress.  Musculoskeletal: He exhibits edema and tenderness (t the base of the left thumb. Grip is diminished to 2-3/5).  Neurological: He is alert and oriented to person, place, and time.  Vitals reviewed.    Lab Results  Component Value Date   WBC 8.1 12/21/2015   HGB 15.1 11/21/2014   HCT 43.2 12/21/2015   PLT 214 12/21/2015   GLUCOSE 105 (H) 03/21/2016   CHOL 111 12/21/2015   TRIG 62 12/21/2015   HDL 47 12/21/2015   LDLCALC 52 12/21/2015   ALT 27 03/21/2016   AST 24 03/21/2016   NA 141 03/21/2016   K 4.8 03/21/2016   CL 103 03/21/2016   CREATININE 0.92 03/21/2016   BUN 18 03/21/2016   CO2 24 03/21/2016   TSH 3.010 12/21/2015   PSA 1.3 11/21/2014   HGBA1C 5.9 08/27/2015    No results found.  Assessment & Plan:   Arthur Franklin was seen today for joint pain.  Diagnoses and all orders for this visit:  Pain of  left thumb -     DG Finger Thumb Left; Future -     CBC with Differential/Platelet -     Uric acid  Other orders -     diclofenac (VOLTAREN) 75 MG EC tablet; Take 1 tablet (75 mg total) by mouth 2 (two) times daily. For muscle and  Joint pain    XR - no fx noted. MCP has small spur, sesamoid   I am having Arthur Franklin start on diclofenac. I am also having him maintain his atorvastatin, levothyroxine, and metFORMIN.  Meds ordered this encounter  Medications  . diclofenac (VOLTAREN) 75 MG EC tablet    Sig: Take 1 tablet (75 mg total) by mouth 2 (two) times daily. For muscle and  Joint pain    Dispense:  60 tablet    Refill:  2     Follow-up: Return in about 3 months  (around 07/22/2016), or if symptoms worsen or fail to improve.  Claretta Fraise, M.D.

## 2016-04-23 LAB — CBC WITH DIFFERENTIAL/PLATELET
BASOS: 0 %
Basophils Absolute: 0 10*3/uL (ref 0.0–0.2)
EOS (ABSOLUTE): 0.4 10*3/uL (ref 0.0–0.4)
EOS: 4 %
HEMATOCRIT: 44.4 % (ref 37.5–51.0)
HEMOGLOBIN: 15 g/dL (ref 12.6–17.7)
IMMATURE GRANS (ABS): 0 10*3/uL (ref 0.0–0.1)
IMMATURE GRANULOCYTES: 0 %
LYMPHS: 29 %
Lymphocytes Absolute: 2.9 10*3/uL (ref 0.7–3.1)
MCH: 32.1 pg (ref 26.6–33.0)
MCHC: 33.8 g/dL (ref 31.5–35.7)
MCV: 95 fL (ref 79–97)
MONOCYTES: 12 %
Monocytes Absolute: 1.2 10*3/uL — ABNORMAL HIGH (ref 0.1–0.9)
NEUTROS ABS: 5.3 10*3/uL (ref 1.4–7.0)
Neutrophils: 55 %
Platelets: 257 10*3/uL (ref 150–379)
RBC: 4.67 x10E6/uL (ref 4.14–5.80)
RDW: 13.7 % (ref 12.3–15.4)
WBC: 9.9 10*3/uL (ref 3.4–10.8)

## 2016-04-23 LAB — URIC ACID: Uric Acid: 4.3 mg/dL (ref 3.7–8.6)

## 2016-05-14 ENCOUNTER — Other Ambulatory Visit: Payer: Self-pay | Admitting: Family Medicine

## 2016-05-20 ENCOUNTER — Ambulatory Visit (INDEPENDENT_AMBULATORY_CARE_PROVIDER_SITE_OTHER): Payer: Medicare HMO

## 2016-05-20 DIAGNOSIS — Z23 Encounter for immunization: Secondary | ICD-10-CM

## 2016-08-14 ENCOUNTER — Other Ambulatory Visit: Payer: Self-pay | Admitting: Family Medicine

## 2016-09-06 ENCOUNTER — Other Ambulatory Visit: Payer: Self-pay | Admitting: Family Medicine

## 2016-09-13 ENCOUNTER — Other Ambulatory Visit: Payer: Self-pay | Admitting: Family Medicine

## 2016-09-19 ENCOUNTER — Encounter: Payer: Self-pay | Admitting: Family Medicine

## 2016-09-19 ENCOUNTER — Ambulatory Visit (INDEPENDENT_AMBULATORY_CARE_PROVIDER_SITE_OTHER): Payer: Medicare HMO | Admitting: Family Medicine

## 2016-09-19 VITALS — BP 135/72 | HR 59 | Temp 97.2°F | Ht 70.0 in | Wt 163.0 lb

## 2016-09-19 DIAGNOSIS — M79645 Pain in left finger(s): Secondary | ICD-10-CM

## 2016-09-19 DIAGNOSIS — E782 Mixed hyperlipidemia: Secondary | ICD-10-CM | POA: Diagnosis not present

## 2016-09-19 DIAGNOSIS — E039 Hypothyroidism, unspecified: Secondary | ICD-10-CM | POA: Diagnosis not present

## 2016-09-19 DIAGNOSIS — E119 Type 2 diabetes mellitus without complications: Secondary | ICD-10-CM | POA: Diagnosis not present

## 2016-09-19 LAB — BAYER DCA HB A1C WAIVED: HB A1C: 6.2 % (ref <7.0)

## 2016-09-19 MED ORDER — LEVOTHYROXINE SODIUM 50 MCG PO TABS: ORAL_TABLET | ORAL | 3 refills | Status: DC

## 2016-09-19 MED ORDER — DICLOFENAC SODIUM 75 MG PO TBEC: 75.0000 mg | DELAYED_RELEASE_TABLET | Freq: Two times a day (BID) | ORAL | 2 refills | Status: DC

## 2016-09-19 MED ORDER — ATORVASTATIN CALCIUM 40 MG PO TABS: 40.0000 mg | ORAL_TABLET | Freq: Every day | ORAL | 1 refills | Status: DC

## 2016-09-19 MED ORDER — METFORMIN HCL 500 MG PO TABS: ORAL_TABLET | ORAL | 3 refills | Status: DC

## 2016-09-19 NOTE — Addendum Note (Signed)
Addended by: Claretta Fraise on: 09/19/2016 02:31 PM   Modules accepted: Orders

## 2016-09-19 NOTE — Progress Notes (Addendum)
Subjective:  Patient ID: Arthur Franklin, male    DOB: September 18, 1931  Age: 83 y.o. MRN: [id number]  CC: Diabetes (pt here today for routine follow up on his diabetes and cholesterol. He would also like refills on his diclofenac.)   HPI Arthur Franklin presents for  follow-up of hypertension. Patient has no history of headache chest pain or shortness of breath or recent cough. Patient also denies symptoms of TIA such as numbness weakness lateralizing. Patient checks  blood pressure at home and has not had any elevated readings recently. Patient denies side effects from his medication. States taking it regularly.  Patient also  in for follow-up of elevated cholesterol. Doing well without complaints on current medication. Denies side effects of statin including myalgia and arthralgia and nausea. Also in today for liver function testing. Currently no chest pain, shortness of breath or other cardiovascular related symptoms noted.  Follow-up of diabetes. Patient does not check blood sugar at home. Never has. Patient denies symptoms such as polyuria, polydipsia, excessive hunger, nausea No significant hypoglycemic spells noted. Medications as noted below. Taking them regularly without complication/adverse reaction being reported today.    History Arthur Franklin has a past medical history of Diabetes mellitus without complication ([place]); Hyperlipidemia; and Thyroid disease.   He has no past surgical history on file.   His family history is not on file.He reports that he has never smoked. He has never used smokeless tobacco. He reports that he does not drink alcohol or use drugs.  No current outpatient prescriptions on file prior to visit.   No current facility-administered medications on file prior to visit.     ROS Review of Systems  Constitutional: Negative for chills, diaphoresis, fever and unexpected weight change.  HENT: Negative for congestion, hearing loss, rhinorrhea and sore throat.   Eyes: Negative  for visual disturbance.  Respiratory: Negative for cough and shortness of breath.   Cardiovascular: Negative for chest pain.  Gastrointestinal: Negative for abdominal pain, constipation and diarrhea.  Genitourinary: Negative for dysuria and flank pain.  Musculoskeletal: Positive for arthralgias (left thumb). Negative for joint swelling.  Skin: Negative for rash.  Neurological: Negative for dizziness and headaches.  Psychiatric/Behavioral: Negative for dysphoric mood and sleep disturbance.    Objective:  BP 135/72   Pulse (!) 59   Temp 97.2 F (36.2 C) (Oral)   Ht '5\' 10"'  (1.778 m)   Wt 163 lb (73.9 kg)   BMI 23.39 kg/m   BP Readings from Last 3 Encounters:  09/19/16 135/72  04/22/16 128/69  03/21/16 135/70    Wt Readings from Last 3 Encounters:  09/19/16 163 lb (73.9 kg)  04/22/16 161 lb 2 oz (73.1 kg)  03/21/16 162 lb 6.4 oz (73.7 kg)     Physical Exam  Constitutional: He is oriented to person, place, and time. He appears well-developed and well-nourished. No distress.  HENT:  Head: Normocephalic and atraumatic.  Right Ear: External ear normal.  Left Ear: External ear normal.  Nose: Nose normal.  Mouth/Throat: Oropharynx is clear and moist.  Eyes: Conjunctivae and EOM are normal. Pupils are equal, round, and reactive to light.  Neck: Normal range of motion. Neck supple. No thyromegaly present.  Cardiovascular: Normal rate, regular rhythm and normal heart sounds.   No murmur heard. Pulmonary/Chest: Effort normal and breath sounds normal. No respiratory distress. He has no wheezes. He has no rales.  Abdominal: Soft. Bowel sounds are normal. He exhibits no distension. There is no tenderness.  Musculoskeletal: Normal range of  motion. He exhibits no edema, tenderness or deformity.  Left hand & thumb have excellent grip 5/5 strength. No edema, erythema or tenderness   Lymphadenopathy:    He has no cervical adenopathy.  Neurological: He is alert and oriented to person,  place, and time. He has normal reflexes.  Skin: Skin is warm and dry.  Psychiatric: He has a normal mood and affect. His behavior is normal. Judgment and thought content normal.    No components found for: [id number]  Lab Results  Component Value Date   WBC 9.9 04/22/2016   HGB 15.1 11/21/2014   HCT 44.4 04/22/2016   PLT 257 04/22/2016   GLUCOSE 105 (H) 03/21/2016   CHOL 111 12/21/2015   TRIG 62 12/21/2015   HDL 47 12/21/2015   LDLCALC 52 12/21/2015   ALT 27 03/21/2016   AST 24 03/21/2016   NA 141 03/21/2016   K 4.8 03/21/2016   CL 103 03/21/2016   CREATININE 0.92 03/21/2016   BUN 18 03/21/2016   CO2 24 03/21/2016   TSH 3.010 12/21/2015   PSA 1.3 11/21/2014   HGBA1C 5.9 08/27/2015       Assessment & Plan:   Arthur Franklin was seen today for diabetes.  Diagnoses and all orders for this visit:  Type 2 diabetes mellitus without complication, without long-term current use of insulin ([facility]) -     CMP14+EGFR -     Bayer DCA Hb A1c Waived  Mixed hyperlipidemia -     CMP14+EGFR  Hypothyroidism, unspecified type -     CMP14+EGFR  Pain of left thumb  Other orders -     metFORMIN (GLUCOPHAGE) 500 MG tablet; TAKE ONE TABLET BY MOUTH ONCE DAILY WITH BREAKFAST -     levothyroxine (SYNTHROID, LEVOTHROID) 50 MCG tablet; TAKE ONE TABLET BY MOUTH ONCE DAILY BEFORE BREAKFAST -     atorvastatin (LIPITOR) 40 MG tablet; Take 1 tablet (40 mg total) by mouth daily. -     diclofenac (VOLTAREN) 75 MG EC tablet; Take 1 tablet (75 mg total) by mouth 2 (two) times daily. Forthumb and other  Joint pain   I have changed Arthur Franklin atorvastatin and diclofenac. I am also having him maintain his metFORMIN and levothyroxine.  Meds ordered this encounter  Medications  . metFORMIN (GLUCOPHAGE) 500 MG tablet    Sig: TAKE ONE TABLET BY MOUTH ONCE DAILY WITH BREAKFAST    Dispense:  90 tablet    Refill:  3    Please consider 90 day supplies to promote better adherence  . levothyroxine  (SYNTHROID, LEVOTHROID) 50 MCG tablet    Sig: TAKE ONE TABLET BY MOUTH ONCE DAILY BEFORE BREAKFAST    Dispense:  90 tablet    Refill:  3    Please consider 90 day supplies to promote better adherence  . atorvastatin (LIPITOR) 40 MG tablet    Sig: Take 1 tablet (40 mg total) by mouth daily.    Dispense:  90 tablet    Refill:  1  . diclofenac (VOLTAREN) 75 MG EC tablet    Sig: Take 1 tablet (75 mg total) by mouth 2 (two) times daily. Forthumb and other  Joint pain    Dispense:  60 tablet    Refill:  2     Follow-up: Return in about 3 months (around 12/17/2016).  Claretta Fraise, M.D.

## 2016-09-20 LAB — CMP14+EGFR
ALBUMIN: 4.2 g/dL (ref 3.5–4.7)
ALT: 24 IU/L (ref 0–44)
AST: 22 IU/L (ref 0–40)
Albumin/Globulin Ratio: 1.8 (ref 1.2–2.2)
Alkaline Phosphatase: 88 IU/L (ref 39–117)
BUN / CREAT RATIO: 17 (ref 10–24)
BUN: 16 mg/dL (ref 8–27)
Bilirubin Total: 0.4 mg/dL (ref 0.0–1.2)
CALCIUM: 9 mg/dL (ref 8.6–10.2)
CO2: 25 mmol/L (ref 18–29)
CREATININE: 0.94 mg/dL (ref 0.76–1.27)
Chloride: 102 mmol/L (ref 96–106)
GFR calc Af Amer: 87 mL/min/{1.73_m2} (ref >59)
GFR, EST NON AFRICAN AMERICAN: 75 mL/min/{1.73_m2} (ref >59)
GLOBULIN, TOTAL: 2.3 g/dL (ref 1.5–4.5)
Glucose: 109 mg/dL — ABNORMAL HIGH (ref 65–99)
POTASSIUM: 4.4 mmol/L (ref 3.5–5.2)
SODIUM: 142 mmol/L (ref 134–144)
Total Protein: 6.5 g/dL (ref 6.0–8.5)

## 2016-10-09 DIAGNOSIS — R001 Bradycardia, unspecified: Secondary | ICD-10-CM | POA: Diagnosis not present

## 2016-10-09 DIAGNOSIS — E039 Hypothyroidism, unspecified: Secondary | ICD-10-CM | POA: Diagnosis not present

## 2016-10-09 DIAGNOSIS — K08109 Complete loss of teeth, unspecified cause, unspecified class: Secondary | ICD-10-CM | POA: Diagnosis not present

## 2016-10-09 DIAGNOSIS — Z7984 Long term (current) use of oral hypoglycemic drugs: Secondary | ICD-10-CM | POA: Diagnosis not present

## 2016-10-09 DIAGNOSIS — Z0000 Encounter for general adult medical examination without abnormal findings: Secondary | ICD-10-CM | POA: Diagnosis not present

## 2016-10-09 DIAGNOSIS — Z9181 History of falling: Secondary | ICD-10-CM | POA: Diagnosis not present

## 2016-10-09 DIAGNOSIS — Z7982 Long term (current) use of aspirin: Secondary | ICD-10-CM | POA: Diagnosis not present

## 2016-10-09 DIAGNOSIS — E119 Type 2 diabetes mellitus without complications: Secondary | ICD-10-CM | POA: Diagnosis not present

## 2016-10-09 DIAGNOSIS — Z6824 Body mass index (BMI) 24.0-24.9, adult: Secondary | ICD-10-CM | POA: Diagnosis not present

## 2016-10-09 DIAGNOSIS — E785 Hyperlipidemia, unspecified: Secondary | ICD-10-CM | POA: Diagnosis not present

## 2016-12-22 ENCOUNTER — Encounter: Payer: Self-pay | Admitting: Family Medicine

## 2016-12-22 ENCOUNTER — Ambulatory Visit (INDEPENDENT_AMBULATORY_CARE_PROVIDER_SITE_OTHER): Payer: Medicare HMO | Admitting: Family Medicine

## 2016-12-22 VITALS — BP 136/74 | HR 53 | Temp 96.8°F | Ht 70.0 in | Wt 165.0 lb

## 2016-12-22 DIAGNOSIS — M1990 Unspecified osteoarthritis, unspecified site: Secondary | ICD-10-CM | POA: Insufficient documentation

## 2016-12-22 DIAGNOSIS — E119 Type 2 diabetes mellitus without complications: Secondary | ICD-10-CM

## 2016-12-22 DIAGNOSIS — E039 Hypothyroidism, unspecified: Secondary | ICD-10-CM | POA: Diagnosis not present

## 2016-12-22 DIAGNOSIS — E782 Mixed hyperlipidemia: Secondary | ICD-10-CM | POA: Diagnosis not present

## 2016-12-22 LAB — URINALYSIS
BILIRUBIN UA: NEGATIVE
GLUCOSE, UA: NEGATIVE
Leukocytes, UA: NEGATIVE
Nitrite, UA: NEGATIVE
PH UA: 7 (ref 5.0–7.5)
RBC, UA: NEGATIVE
Specific Gravity, UA: 1.02 (ref 1.005–1.030)
Urobilinogen, Ur: 2 mg/dL — ABNORMAL HIGH (ref 0.2–1.0)

## 2016-12-22 LAB — BAYER DCA HB A1C WAIVED: HB A1C: 6.1 % (ref <7.0)

## 2016-12-22 MED ORDER — DICLOFENAC SODIUM 75 MG PO TBEC: 75.0000 mg | DELAYED_RELEASE_TABLET | Freq: Two times a day (BID) | ORAL | 2 refills | Status: DC

## 2016-12-22 NOTE — Progress Notes (Signed)
Subjective:  Patient ID: Arthur Franklin, male    DOB: February 16, 1932  Age: 83 y.o. MRN: [id number]  CC: Diabetes (pt here today for routine follow up on chronic medical conditions. No concerns voiced.)   HPI [name] presents for  follow-up of hypertension. Patient has no history of headache chest pain or shortness of breath or recent cough. Patient also denies symptoms of TIA such as numbness weakness lateralizing. Patient denies side effects from medication. States taking it regularly.  Patient also  in for follow-up of elevated cholesterol. Doing well without complaints on current medication. Denies side effects of statin including myalgia and arthralgia and nausea. Also in today for liver function testing. Currently no chest pain, shortness of breath or other cardiovascular related symptoms noted.He does have AM stiffness. Increases with strenuous activities.  Follow-up of diabetes. Patient does not check blood sugar at home. Patient denies symptoms such as polyuria, polydipsia, excessive hunger, nausea No significant hypoglycemic spells noted. Medications reviewed. Pt reports taking them regularly. Pt. denies complication/adverse reaction today.   Patient presents for follow-up on  thyroid. The patient has a history of hypothyroidism for many years. It has been stable recently. Pt. denies any change in  voice, loss of hair, heat or cold intolerance. Energy level has been adequate to good. Patient denies constipation and diarrhea. No myxedema. Medication is as noted below. Verified that pt is taking it daily on an empty stomach. Well tolerated.   History [name] has a past medical history of Diabetes mellitus without complication ([place]); Hyperlipidemia; and Thyroid disease.   He has no past surgical history on file.   His family history is not on file.He reports that he has never smoked. He has never used smokeless tobacco. He reports that he does not drink alcohol or use drugs.  Current  Outpatient Prescriptions on File Prior to Visit  Medication Sig Dispense Refill  . atorvastatin (LIPITOR) 40 MG tablet Take 1 tablet (40 mg total) by mouth daily. 90 tablet 1  . levothyroxine (SYNTHROID, LEVOTHROID) 50 MCG tablet TAKE ONE TABLET BY MOUTH ONCE DAILY BEFORE BREAKFAST 90 tablet 3  . metFORMIN (GLUCOPHAGE) 500 MG tablet TAKE ONE TABLET BY MOUTH ONCE DAILY WITH BREAKFAST 90 tablet 3   No current facility-administered medications on file prior to visit.     ROS Review of Systems  Constitutional: Negative for chills, diaphoresis, fever and unexpected weight change.  HENT: Negative for congestion, hearing loss, rhinorrhea and sore throat.   Eyes: Negative for visual disturbance.  Respiratory: Negative for cough and shortness of breath.   Cardiovascular: Negative for chest pain.  Gastrointestinal: Negative for abdominal pain, constipation and diarrhea.  Genitourinary: Negative for dysuria and flank pain.  Musculoskeletal: Positive for arthralgias (stiffness). Negative for joint swelling.  Skin: Negative for rash.  Neurological: Negative for dizziness and headaches.  Psychiatric/Behavioral: Negative for dysphoric mood and sleep disturbance.    Objective:  BP (!) 152/71   Pulse (!) 53   Temp (!) 96.8 F (36 C) (Oral)   Ht _0  (1.778 m)   Wt 165 lb (74.8 kg)   BMI 23.68 kg/m   BP Readings from Last 3 Encounters:  12/22/16 (!) 152/71  09/19/16 135/72  04/22/16 128/69    Wt Readings from Last 3 Encounters:  12/22/16 165 lb (74.8 kg)  09/19/16 163 lb (73.9 kg)  04/22/16 161 lb 2 oz (73.1 kg)     Physical Exam  Constitutional: He is oriented to person, place, and time. He appears  well-developed and well-nourished. No distress.  HENT:  Head: Normocephalic and atraumatic.  Right Ear: External ear normal.  Left Ear: External ear normal.  Nose: Nose normal.  Mouth/Throat: Oropharynx is clear and moist.  Eyes: Conjunctivae and EOM are normal. Pupils are equal,  round, and reactive to light.  Neck: Normal range of motion. Neck supple. No thyromegaly present.  Cardiovascular: Normal rate, regular rhythm and normal heart sounds.   No murmur heard. Pulmonary/Chest: Effort normal and breath sounds normal. No respiratory distress. He has no wheezes. He has no rales.  Abdominal: Soft. Bowel sounds are normal. He exhibits no distension. There is no tenderness.  Lymphadenopathy:    He has no cervical adenopathy.  Neurological: He is alert and oriented to person, place, and time. He has normal reflexes.  Skin: Skin is warm and dry.  Psychiatric: He has a normal mood and affect. His behavior is normal. Judgment and thought content normal.    Diabetic Foot Exam - Simple   Simple Foot Form Diabetic Foot exam was performed with the following findings:  Yes 12/22/2016  1:53 PM  Visual Inspection No deformities, no ulcerations, no other skin breakdown bilaterally:  Yes Sensation Testing Intact to touch and monofilament testing bilaterally:  Yes Pulse Check Posterior Tibialis and Dorsalis pulse intact bilaterally:  Yes Comments     .   Assessment & Plan:   [name] was seen today for diabetes.  Diagnoses and all orders for this visit:  Type 2 diabetes mellitus without complication, without long-term current use of insulin (HCC) -     Microalbumin / creatinine urine ratio -     Urinalysis -     Bayer DCA Hb A1c Waived -     Lipid panel -     CMP14+EGFR -     CBC with Differential/Platelet  Hypothyroidism, unspecified type -     CMP14+EGFR -     CBC with Differential/Platelet -     TSH + free T4  Arthritis -     CMP14+EGFR -     diclofenac (VOLTAREN) 75 MG EC tablet; Take 1 tablet (75 mg total) by mouth 2 (two) times daily. For  Joint pain  Mixed hyperlipidemia -     Lipid panel   I have changed [name] diclofenac. I am also having him maintain his metFORMIN, levothyroxine, and atorvastatin.  Meds ordered this encounter  Medications    . diclofenac (VOLTAREN) 75 MG EC tablet    Sig: Take 1 tablet (75 mg total) by mouth 2 (two) times daily. For  Joint pain    Dispense:  60 tablet    Refill:  2    Patient will check blood pressure at home. Due to his diabetes we set a goal of 130/80. He will let me know if his readings are significantly above that   Follow-up: Return in about 3 months (around 03/24/2017).  Claretta Fraise, M.D.

## 2016-12-23 LAB — MICROALBUMIN / CREATININE URINE RATIO
CREATININE, UR: 142.9 mg/dL
Microalb/Creat Ratio: 7.6 mg/g creat (ref 0.0–30.0)
Microalbumin, Urine: 10.8 ug/mL

## 2016-12-23 LAB — CBC WITH DIFFERENTIAL/PLATELET
BASOS ABS: 0.1 10*3/uL (ref 0.0–0.2)
Basos: 1 %
EOS (ABSOLUTE): 0.6 10*3/uL — AB (ref 0.0–0.4)
Eos: 8 %
Hematocrit: 43.5 % (ref 37.5–51.0)
Hemoglobin: 14.7 g/dL (ref 13.0–17.7)
IMMATURE GRANS (ABS): 0 10*3/uL (ref 0.0–0.1)
IMMATURE GRANULOCYTES: 0 %
LYMPHS: 35 %
Lymphocytes Absolute: 2.7 10*3/uL (ref 0.7–3.1)
MCH: 32 pg (ref 26.6–33.0)
MCHC: 33.8 g/dL (ref 31.5–35.7)
MCV: 95 fL (ref 79–97)
MONOS ABS: 0.6 10*3/uL (ref 0.1–0.9)
Monocytes: 8 %
NEUTROS PCT: 48 %
Neutrophils Absolute: 3.6 10*3/uL (ref 1.4–7.0)
PLATELETS: 210 10*3/uL (ref 150–379)
RBC: 4.6 x10E6/uL (ref 4.14–5.80)
RDW: 13.9 % (ref 12.3–15.4)
WBC: 7.6 10*3/uL (ref 3.4–10.8)

## 2016-12-23 LAB — CMP14+EGFR
ALK PHOS: 85 IU/L (ref 39–117)
ALT: 15 IU/L (ref 0–44)
AST: 18 IU/L (ref 0–40)
Albumin/Globulin Ratio: 1.7 (ref 1.2–2.2)
Albumin: 4.3 g/dL (ref 3.5–4.7)
BUN/Creatinine Ratio: 11 (ref 10–24)
BUN: 11 mg/dL (ref 8–27)
Bilirubin Total: 0.4 mg/dL (ref 0.0–1.2)
CALCIUM: 9.2 mg/dL (ref 8.6–10.2)
CO2: 24 mmol/L (ref 18–29)
CREATININE: 1.04 mg/dL (ref 0.76–1.27)
Chloride: 105 mmol/L (ref 96–106)
GFR calc Af Amer: 77 mL/min/{1.73_m2} (ref >59)
GFR, EST NON AFRICAN AMERICAN: 67 mL/min/{1.73_m2} (ref >59)
GLOBULIN, TOTAL: 2.5 g/dL (ref 1.5–4.5)
GLUCOSE: 98 mg/dL (ref 65–99)
Potassium: 4.5 mmol/L (ref 3.5–5.2)
SODIUM: 143 mmol/L (ref 134–144)
Total Protein: 6.8 g/dL (ref 6.0–8.5)

## 2016-12-23 LAB — LIPID PANEL
CHOL/HDL RATIO: 2.7 ratio (ref 0.0–5.0)
CHOLESTEROL TOTAL: 121 mg/dL (ref 100–199)
HDL: 45 mg/dL (ref >39)
LDL CALC: 61 mg/dL (ref 0–99)
Triglycerides: 73 mg/dL (ref 0–149)
VLDL Cholesterol Cal: 15 mg/dL (ref 5–40)

## 2016-12-23 LAB — TSH+FREE T4
Free T4: 1.27 ng/dL (ref 0.82–1.77)
TSH: 4.3 u[IU]/mL (ref 0.450–4.500)

## 2017-03-26 IMAGING — DX DG FINGER THUMB 2+V*L*
3 series · 3 of 3 positions shown · non-contrast
Comparison: None in PACs

CLINICAL DATA: Left thumb pain, question of gout. History of
diabetes.

EXAM:
LEFT THUMB 2+V

[finger ap]
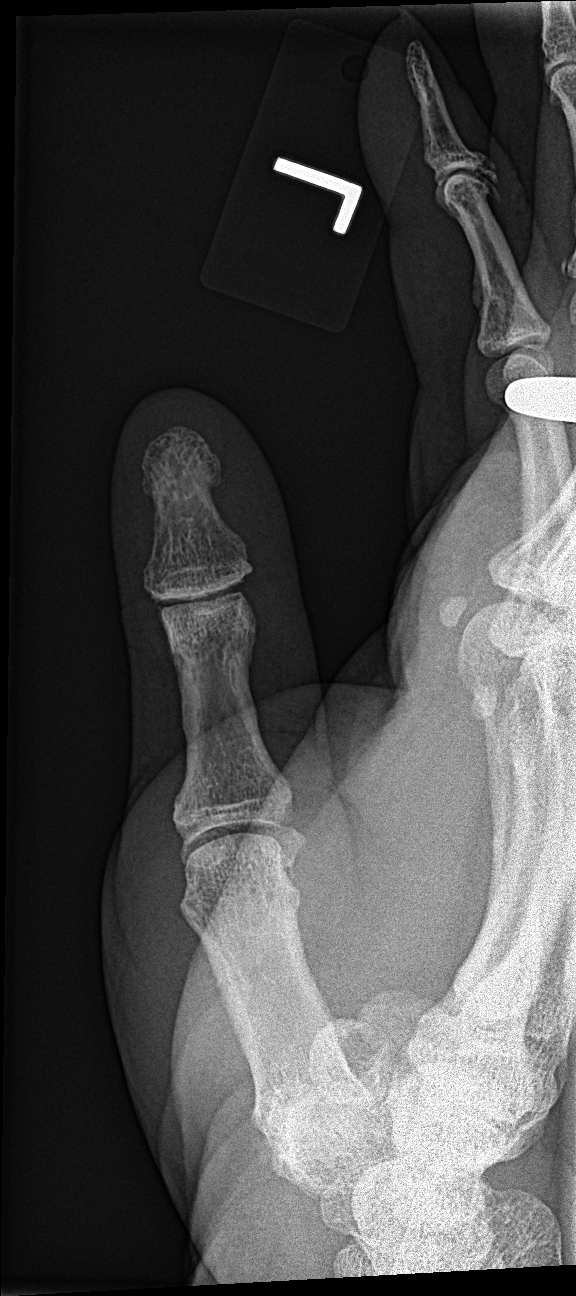

[finger obl]
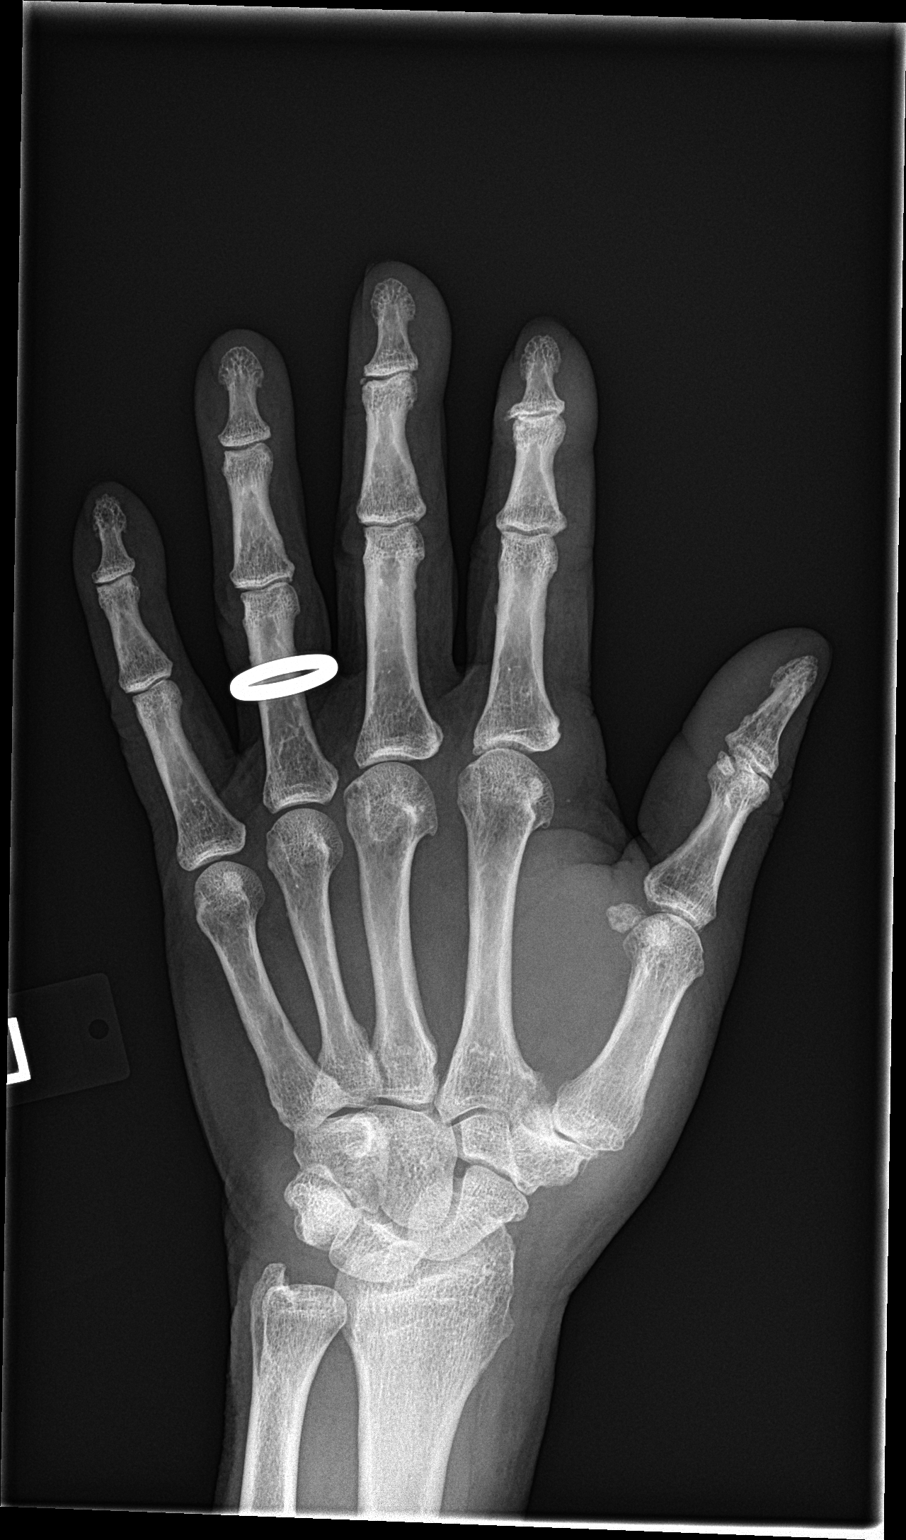

[finger lat]
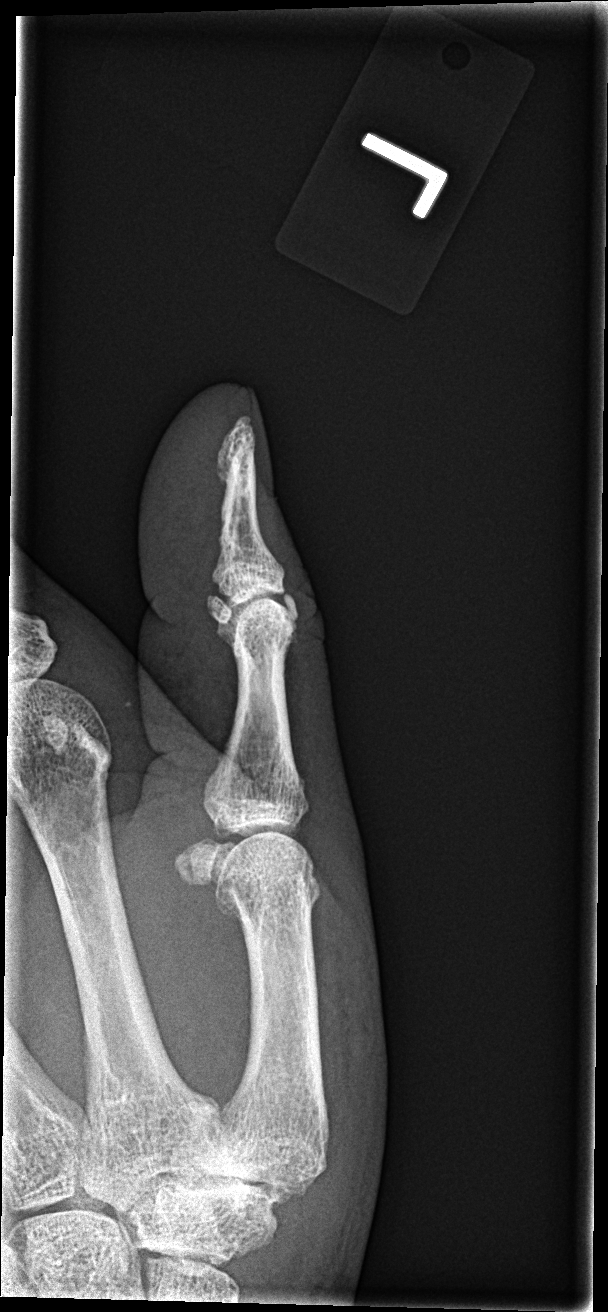

[3 of 3 positions shown; findings below may reference images not displayed]

FINDINGS: The bones are subjectively osteopenic. There is no lytic or blastic
lesion. The MCP and IP joints of the thumb are well maintained.
There is moderate degenerative narrowing of the first CMC joint
space with eburnation of the articular surfaces.

There is mild osteoarthritic change of the DIP joint of the index
finger. No significant abnormality is observed elsewhere.
IMPRESSION: Degenerative change centered on the first CMC joint of the thumb. No
significant abnormality of the thumb is observed elsewhere.

## 2017-06-22 ENCOUNTER — Other Ambulatory Visit: Payer: Self-pay | Admitting: Family Medicine

## 2017-07-03 DIAGNOSIS — R69 Illness, unspecified: Secondary | ICD-10-CM | POA: Diagnosis not present

## 2017-09-22 ENCOUNTER — Other Ambulatory Visit: Payer: Self-pay | Admitting: Family Medicine

## 2017-10-21 ENCOUNTER — Other Ambulatory Visit: Payer: Self-pay | Admitting: Family Medicine

## 2017-10-21 NOTE — Telephone Encounter (Signed)
Authorize 30 days only. Then contact the patient letting them know that they will need an appointment before any further prescriptions can be sent in. 

## 2017-10-21 NOTE — Telephone Encounter (Signed)
Last seen 12/22/16

## 2017-10-23 DIAGNOSIS — R69 Illness, unspecified: Secondary | ICD-10-CM | POA: Diagnosis not present

## 2017-10-27 ENCOUNTER — Other Ambulatory Visit: Payer: Self-pay | Admitting: Family Medicine

## 2017-11-20 ENCOUNTER — Other Ambulatory Visit: Payer: Self-pay | Admitting: Family Medicine

## 2017-11-23 NOTE — Telephone Encounter (Signed)
Authorize 30 days only. Then contact the patient letting them know that they will need an appointment before any further prescriptions can be sent in. 

## 2017-12-05 ENCOUNTER — Other Ambulatory Visit: Payer: Self-pay | Admitting: Family Medicine

## 2017-12-08 ENCOUNTER — Other Ambulatory Visit: Payer: Self-pay | Admitting: *Deleted

## 2017-12-08 MED ORDER — ATORVASTATIN CALCIUM 40 MG PO TABS: ORAL_TABLET | ORAL | 0 refills | Status: DC

## 2017-12-29 ENCOUNTER — Other Ambulatory Visit: Payer: Self-pay | Admitting: Family Medicine

## 2017-12-29 ENCOUNTER — Telehealth: Payer: Self-pay | Admitting: Family Medicine

## 2017-12-29 MED ORDER — ATORVASTATIN CALCIUM 40 MG PO TABS: ORAL_TABLET | ORAL | 0 refills | Status: DC

## 2017-12-29 MED ORDER — LEVOTHYROXINE SODIUM 50 MCG PO TABS
ORAL_TABLET | ORAL | 0 refills | Status: DC
Start: 2017-12-29 — End: 2018-01-26

## 2017-12-29 MED ORDER — METFORMIN HCL 500 MG PO TABS: ORAL_TABLET | ORAL | 0 refills | Status: DC

## 2017-12-29 NOTE — Telephone Encounter (Signed)
Patient notified that rx for 1 month sent to pharmacy

## 2017-12-29 NOTE — Telephone Encounter (Signed)
Patient aware all medications have been sent to pharmacy

## 2017-12-30 MED ORDER — ATORVASTATIN CALCIUM 40 MG PO TABS: ORAL_TABLET | ORAL | 0 refills | Status: DC

## 2018-01-26 ENCOUNTER — Encounter: Payer: Self-pay | Admitting: Family Medicine

## 2018-01-26 ENCOUNTER — Ambulatory Visit: Payer: Medicare HMO | Admitting: Family Medicine

## 2018-01-26 VITALS — BP 137/62 | HR 56 | Temp 97.1°F | Ht 70.0 in | Wt 158.1 lb

## 2018-01-26 DIAGNOSIS — E119 Type 2 diabetes mellitus without complications: Secondary | ICD-10-CM

## 2018-01-26 DIAGNOSIS — E782 Mixed hyperlipidemia: Secondary | ICD-10-CM | POA: Diagnosis not present

## 2018-01-26 DIAGNOSIS — E039 Hypothyroidism, unspecified: Secondary | ICD-10-CM | POA: Diagnosis not present

## 2018-01-26 DIAGNOSIS — Z0000 Encounter for general adult medical examination without abnormal findings: Secondary | ICD-10-CM | POA: Diagnosis not present

## 2018-01-26 LAB — URINALYSIS
BILIRUBIN UA: NEGATIVE
GLUCOSE, UA: NEGATIVE
Ketones, UA: NEGATIVE
Leukocytes, UA: NEGATIVE
Nitrite, UA: NEGATIVE
PH UA: 6 (ref 5.0–7.5)
Protein, UA: NEGATIVE
Specific Gravity, UA: 1.025 (ref 1.005–1.030)
UUROB: 1 mg/dL (ref 0.2–1.0)

## 2018-01-26 LAB — BAYER DCA HB A1C WAIVED: HB A1C: 6.3 % (ref <7.0)

## 2018-01-26 MED ORDER — ATORVASTATIN CALCIUM 40 MG PO TABS: ORAL_TABLET | ORAL | 0 refills | Status: DC

## 2018-01-26 MED ORDER — LEVOTHYROXINE SODIUM 50 MCG PO TABS: ORAL_TABLET | ORAL | 0 refills | Status: DC

## 2018-01-26 MED ORDER — METFORMIN HCL 500 MG PO TABS: ORAL_TABLET | ORAL | 0 refills | Status: DC

## 2018-01-26 NOTE — Progress Notes (Signed)
Subjective:  Patient ID: Arthur Franklin,  male    DOB: 10-19-1931  Age: 84 y.o.    CC: Medical Management of Chronic Issues   HPI Jacquis Paxton presents for  follow-up of patient presents for follow-up on  thyroid. The patient has a history of hypothyroidism for many years. It has been stable recently. Pt. denies any change in  voice, loss of hair, heat or cold intolerance. Energy level has been adequate to good. Patient denies constipation and diarrhea. No myxedema. Medication is as noted below. Verified that pt is taking it daily on an empty stomach. Well tolerated.  He did run out a couple of weeks ago but has noted no symptoms as above.  He has been taking it meticulously until that time. Patient also  in for follow-up of elevated cholesterol. Doing well without complaints on current medication. Denies side effects  including myalgia and arthralgia and nausea. Also in today for liver function testing. Currently no chest pain, shortness of breath or other cardiovascular related symptoms noted.  Follow-up of diabetes. Patient does not check blood sugar at home. Patient denies symptoms such as excessive hunger or urinary frequency, excessive hunger, nausea No significant hypoglycemic spells noted. Medications reviewed. Pt reports taking them regularly. Pt. denies complication/adverse reaction today.    History Ulisses has a past medical history of Diabetes mellitus without complication ([place]), Hyperlipidemia, and Thyroid disease.   He has no past surgical history on file.   His family history is not on file.He reports that he has never smoked. He has never used smokeless tobacco. He reports that he does not drink alcohol or use drugs.  No current outpatient medications on file prior to visit.   No current facility-administered medications on file prior to visit.     ROS Review of Systems  Constitutional: Negative.   HENT: Negative.   Eyes: Negative for visual disturbance.    Respiratory: Negative for cough and shortness of breath.   Cardiovascular: Negative for chest pain and leg swelling.  Gastrointestinal: Negative for abdominal pain, diarrhea, nausea and vomiting.  Genitourinary: Negative for difficulty urinating.  Musculoskeletal: Negative for arthralgias and myalgias.  Skin: Negative for rash.  Neurological: Negative for headaches.  Psychiatric/Behavioral: Negative for sleep disturbance.    Objective:  BP 137/62   Pulse (!) 56   Temp (!) 97.1 F (36.2 C) (Oral)   Ht '5\' 10"'  (1.778 m)   Wt 158 lb 2 oz (71.7 kg)   BMI 22.69 kg/m   BP Readings from Last 3 Encounters:  01/26/18 137/62  12/22/16 136/74  09/19/16 135/72    Wt Readings from Last 3 Encounters:  01/26/18 158 lb 2 oz (71.7 kg)  12/22/16 165 lb (74.8 kg)  09/19/16 163 lb (73.9 kg)     Physical Exam  Constitutional: He is oriented to person, place, and time. He appears well-developed and well-nourished. No distress.  HENT:  Head: Normocephalic and atraumatic.  Right Ear: External ear normal.  Left Ear: External ear normal.  Nose: Nose normal.  Mouth/Throat: Oropharynx is clear and moist.  Eyes: Pupils are equal, round, and reactive to light. Conjunctivae and EOM are normal.  Neck: Normal range of motion. Neck supple. No thyromegaly present.  Cardiovascular: Normal rate, regular rhythm and normal heart sounds.  No murmur heard. Pulmonary/Chest: Effort normal and breath sounds normal. No respiratory distress. He has no wheezes. He has no rales.  Abdominal: Soft. Bowel sounds are normal. He exhibits no distension. There is no  tenderness.  Lymphadenopathy:    He has no cervical adenopathy.  Neurological: He is alert and oriented to person, place, and time. He has normal reflexes.  Skin: Skin is warm and dry.  Psychiatric: He has a normal mood and affect. His behavior is normal. Judgment and thought content normal.    Diabetic Foot Exam - Simple   Simple Foot Form Diabetic  Foot exam was performed with the following findings:  Yes 01/26/2018 11:10 AM  Visual Inspection No deformities, no ulcerations, no other skin breakdown bilaterally:  Yes Sensation Testing Intact to touch and monofilament testing bilaterally:  Yes Pulse Check Posterior Tibialis and Dorsalis pulse intact bilaterally:  Yes Comments       Assessment & Plan:   Mccauley was seen today for medical management of chronic issues.  Diagnoses and all orders for this visit:  Type 2 diabetes mellitus without complication, without long-term current use of insulin (HCC) -     Microalbumin / creatinine urine ratio -     Urinalysis -     Bayer DCA Hb A1c Waived  Mixed hyperlipidemia -     CBC with Differential/Platelet -     CMP14+EGFR -     Lipid panel  Well adult exam -     CBC with Differential/Platelet -     CMP14+EGFR -     Lipid panel -     TSH -     Microalbumin / creatinine urine ratio -     Urinalysis -     T4, Free -     Bayer DCA Hb A1c Waived  Hypothyroidism, unspecified type -     TSH -     T4, Free  Other orders -     atorvastatin (LIPITOR) 40 MG tablet; TAKE 1 TABLET BY MOUTH ONCE DAILY (NEEDS  OFFICE  VISIT  FOR  REFILLS) -     levothyroxine (SYNTHROID, LEVOTHROID) 50 MCG tablet; TAKE 1 TABLET BY MOUTH ONCE DAILY BEFORE BREAKFAST -     metFORMIN (GLUCOPHAGE) 500 MG tablet; TAKE 1 TABLET BY MOUTH ONCE DAILY WITH BREAKFAST   I have discontinued Randale L. Koestner's diclofenac. I have also changed his metFORMIN. Additionally, I am having him maintain his atorvastatin and levothyroxine.  Meds ordered this encounter  Medications  . atorvastatin (LIPITOR) 40 MG tablet    Sig: TAKE 1 TABLET BY MOUTH ONCE DAILY (NEEDS  OFFICE  VISIT  FOR  REFILLS)    Dispense:  90 tablet    Refill:  0  . levothyroxine (SYNTHROID, LEVOTHROID) 50 MCG tablet    Sig: TAKE 1 TABLET BY MOUTH ONCE DAILY BEFORE BREAKFAST    Dispense:  90 tablet    Refill:  0    Please consider 90 day supplies to  promote better adherence  . metFORMIN (GLUCOPHAGE) 500 MG tablet    Sig: TAKE 1 TABLET BY MOUTH ONCE DAILY WITH BREAKFAST    Dispense:  90 tablet    Refill:  0     Follow-up: Return in about 3 months (around 04/28/2018).  Claretta Fraise, M.D.

## 2018-01-27 ENCOUNTER — Other Ambulatory Visit: Payer: Self-pay | Admitting: Family Medicine

## 2018-01-27 ENCOUNTER — Other Ambulatory Visit: Payer: Self-pay | Admitting: *Deleted

## 2018-01-27 LAB — CBC WITH DIFFERENTIAL/PLATELET
BASOS: 1 %
Basophils Absolute: 0.1 10*3/uL (ref 0.0–0.2)
EOS (ABSOLUTE): 0.4 10*3/uL (ref 0.0–0.4)
EOS: 6 %
HEMATOCRIT: 42.8 % (ref 37.5–51.0)
Hemoglobin: 14.4 g/dL (ref 13.0–17.7)
IMMATURE GRANS (ABS): 0 10*3/uL (ref 0.0–0.1)
IMMATURE GRANULOCYTES: 0 %
LYMPHS: 30 %
Lymphocytes Absolute: 2 10*3/uL (ref 0.7–3.1)
MCH: 31.5 pg (ref 26.6–33.0)
MCHC: 33.6 g/dL (ref 31.5–35.7)
MCV: 94 fL (ref 79–97)
Monocytes Absolute: 0.7 10*3/uL (ref 0.1–0.9)
Monocytes: 11 %
NEUTROS PCT: 52 %
Neutrophils Absolute: 3.5 10*3/uL (ref 1.4–7.0)
PLATELETS: 264 10*3/uL (ref 150–450)
RBC: 4.57 x10E6/uL (ref 4.14–5.80)
RDW: 13.7 % (ref 12.3–15.4)
WBC: 6.6 10*3/uL (ref 3.4–10.8)

## 2018-01-27 LAB — CMP14+EGFR
ALT: 13 IU/L (ref 0–44)
AST: 18 IU/L (ref 0–40)
Albumin/Globulin Ratio: 1.8 (ref 1.2–2.2)
Albumin: 4.3 g/dL (ref 3.5–4.7)
Alkaline Phosphatase: 83 IU/L (ref 39–117)
BUN/Creatinine Ratio: 16 (ref 10–24)
BUN: 15 mg/dL (ref 8–27)
Bilirubin Total: 0.4 mg/dL (ref 0.0–1.2)
CO2: 27 mmol/L (ref 20–29)
Calcium: 9.5 mg/dL (ref 8.6–10.2)
Chloride: 103 mmol/L (ref 96–106)
Creatinine, Ser: 0.93 mg/dL (ref 0.76–1.27)
GFR calc Af Amer: 87 mL/min/{1.73_m2} (ref >59)
GFR calc non Af Amer: 76 mL/min/{1.73_m2} (ref >59)
Globulin, Total: 2.4 g/dL (ref 1.5–4.5)
Glucose: 105 mg/dL — ABNORMAL HIGH (ref 65–99)
Potassium: 4.9 mmol/L (ref 3.5–5.2)
Sodium: 143 mmol/L (ref 134–144)
Total Protein: 6.7 g/dL (ref 6.0–8.5)

## 2018-01-27 LAB — MICROALBUMIN / CREATININE URINE RATIO
Creatinine, Urine: 116.3 mg/dL
Microalb/Creat Ratio: 3.5 mg/g creat (ref 0.0–30.0)
Microalbumin, Urine: 4.1 ug/mL

## 2018-01-27 LAB — LIPID PANEL
CHOL/HDL RATIO: 2.6 ratio (ref 0.0–5.0)
Cholesterol, Total: 103 mg/dL (ref 100–199)
HDL: 39 mg/dL — AB (ref >39)
LDL CALC: 52 mg/dL (ref 0–99)
TRIGLYCERIDES: 59 mg/dL (ref 0–149)
VLDL CHOLESTEROL CAL: 12 mg/dL (ref 5–40)

## 2018-01-27 LAB — TSH: TSH: 7.33 u[IU]/mL — ABNORMAL HIGH (ref 0.450–4.500)

## 2018-01-27 LAB — T4, FREE: FREE T4: 1.15 ng/dL (ref 0.82–1.77)

## 2018-01-27 MED ORDER — LEVOTHYROXINE SODIUM 75 MCG PO TABS: ORAL_TABLET | ORAL | 0 refills | Status: DC

## 2018-01-27 MED ORDER — ATORVASTATIN CALCIUM 40 MG PO TABS: ORAL_TABLET | ORAL | 0 refills | Status: DC

## 2018-01-27 MED ORDER — METFORMIN HCL 500 MG PO TABS: ORAL_TABLET | ORAL | 0 refills | Status: DC

## 2018-04-28 ENCOUNTER — Ambulatory Visit: Payer: Medicare HMO | Admitting: Family Medicine

## 2018-05-03 ENCOUNTER — Encounter: Payer: Self-pay | Admitting: Family Medicine

## 2018-05-03 ENCOUNTER — Ambulatory Visit (INDEPENDENT_AMBULATORY_CARE_PROVIDER_SITE_OTHER): Payer: Medicare HMO | Admitting: Family Medicine

## 2018-05-03 VITALS — BP 124/75 | HR 67 | Temp 97.3°F | Ht 70.0 in | Wt 156.0 lb

## 2018-05-03 DIAGNOSIS — M1990 Unspecified osteoarthritis, unspecified site: Secondary | ICD-10-CM

## 2018-05-03 DIAGNOSIS — E119 Type 2 diabetes mellitus without complications: Secondary | ICD-10-CM | POA: Diagnosis not present

## 2018-05-03 DIAGNOSIS — E039 Hypothyroidism, unspecified: Secondary | ICD-10-CM | POA: Diagnosis not present

## 2018-05-03 LAB — BAYER DCA HB A1C WAIVED: HB A1C: 6.2 % (ref <7.0)

## 2018-05-03 MED ORDER — ATORVASTATIN CALCIUM 40 MG PO TABS: ORAL_TABLET | ORAL | 1 refills | Status: DC

## 2018-05-03 MED ORDER — LEVOTHYROXINE SODIUM 75 MCG PO TABS: ORAL_TABLET | ORAL | 1 refills | Status: DC

## 2018-05-03 MED ORDER — METFORMIN HCL 500 MG PO TABS: ORAL_TABLET | ORAL | 1 refills | Status: DC

## 2018-05-03 NOTE — Progress Notes (Signed)
Subjective:  Patient ID: Arthur Franklin, male    DOB: 1932-03-02  Age: 84 y.o. MRN: [id number]  CC: Medical Management of Chronic Issues   HPI Arthur Franklin presents forFollow-up of diabetes. Patient does not check blood sugar at home.  Patient denies symptoms such as polyuria, polydipsia, excessive hunger, nausea No significant hypoglycemic spells noted. Medications reviewed. Pt reports taking them regularly without complication/adverse reaction being reported today.  Checking feet daily. Eye appointment is due  Patient's thyroid blood test was suggestive of underactivity when he was here 3 months ago therefore his dose was increased.  Today he tells me that he went ahead with the increase but felt it was probably unnecessary because he been off of his thyroid medicine a few weeks at the time the blood was drawn.  History Arthur Franklin has a past medical history of Diabetes mellitus without complication ([place]), Hyperlipidemia, and Thyroid disease.   He has no past surgical history on file.   His family history is not on file.He reports that he has never smoked. He has never used smokeless tobacco. He reports that he does not drink alcohol or use drugs.  No current outpatient medications on file prior to visit.   No current facility-administered medications on file prior to visit.     ROS Review of Systems  Constitutional: Negative.   HENT: Negative.   Eyes: Negative for visual disturbance.  Respiratory: Negative for cough and shortness of breath.   Cardiovascular: Negative for chest pain and leg swelling.  Gastrointestinal: Negative for abdominal pain, diarrhea, nausea and vomiting.  Genitourinary: Negative for difficulty urinating.  Musculoskeletal: Negative for arthralgias and myalgias.  Skin: Negative for rash.  Neurological: Negative for headaches.  Psychiatric/Behavioral: Negative for sleep disturbance.    Objective:  BP 124/75   Pulse 67   Temp (!) 97.3 F (36.3 C)  (Oral)   Ht _0  (1.778 m)   Wt 156 lb (70.8 kg)   BMI 22.38 kg/m   BP Readings from Last 3 Encounters:  05/03/18 124/75  01/26/18 137/62  12/22/16 136/74    Wt Readings from Last 3 Encounters:  05/03/18 156 lb (70.8 kg)  01/26/18 158 lb 2 oz (71.7 kg)  12/22/16 165 lb (74.8 kg)     Physical Exam  Constitutional: He is oriented to person, place, and time. He appears well-developed and well-nourished. No distress.  HENT:  Head: Normocephalic and atraumatic.  Right Ear: External ear normal.  Left Ear: External ear normal.  Nose: Nose normal.  Mouth/Throat: Oropharynx is clear and moist.  Eyes: Pupils are equal, round, and reactive to light. Conjunctivae and EOM are normal.  Neck: Normal range of motion. Neck supple.  Cardiovascular: Normal rate, regular rhythm and normal heart sounds.  No murmur heard. Pulmonary/Chest: Effort normal and breath sounds normal. No respiratory distress. He has no wheezes. He has no rales.  Abdominal: Soft. There is no tenderness.  Musculoskeletal: Normal range of motion.  Neurological: He is alert and oriented to person, place, and time. He has normal reflexes.  Skin: Skin is warm and dry.  Psychiatric: He has a normal mood and affect. His behavior is normal. Judgment and thought content normal.      Assessment & Plan:   Arthur Franklin was seen today for medical management of chronic issues.  Diagnoses and all orders for this visit:  Type 2 diabetes mellitus without complication, without long-term current use of insulin (HCC) -     CMP14+EGFR -  Bayer DCA Hb A1c Waived  Arthritis  Hypothyroidism, unspecified type -     TSH -     T4, Free  Other orders -     atorvastatin (LIPITOR) 40 MG tablet; TAKE 1 TABLET BY MOUTH ONCE DAILY (NEEDS  OFFICE  VISIT  FOR  REFILLS) -     levothyroxine (SYNTHROID, LEVOTHROID) 75 MCG tablet; TAKE 1 TABLET BY MOUTH ONCE DAILY BEFORE BREAKFAST -     metFORMIN (GLUCOPHAGE) 500 MG tablet; TAKE 1 TABLET BY  MOUTH ONCE DAILY WITH BREAKFAST      I am having Arthur Franklin maintain his atorvastatin, levothyroxine, and metFORMIN.  Meds ordered this encounter  Medications  . atorvastatin (LIPITOR) 40 MG tablet    Sig: TAKE 1 TABLET BY MOUTH ONCE DAILY (NEEDS  OFFICE  VISIT  FOR  REFILLS)    Dispense:  90 tablet    Refill:  1  . levothyroxine (SYNTHROID, LEVOTHROID) 75 MCG tablet    Sig: TAKE 1 TABLET BY MOUTH ONCE DAILY BEFORE BREAKFAST    Dispense:  90 tablet    Refill:  1    Please consider 90 day supplies to promote better adherence  . metFORMIN (GLUCOPHAGE) 500 MG tablet    Sig: TAKE 1 TABLET BY MOUTH ONCE DAILY WITH BREAKFAST    Dispense:  90 tablet    Refill:  1     Follow-up: Return in about 3 months (around 08/02/2018).  Claretta Fraise, M.D.

## 2018-05-04 LAB — CMP14+EGFR
ALK PHOS: 77 IU/L (ref 39–117)
ALT: 15 IU/L (ref 0–44)
AST: 19 IU/L (ref 0–40)
Albumin/Globulin Ratio: 1.8 (ref 1.2–2.2)
Albumin: 4.3 g/dL (ref 3.5–4.7)
BUN/Creatinine Ratio: 13 (ref 10–24)
BUN: 15 mg/dL (ref 8–27)
Bilirubin Total: 0.4 mg/dL (ref 0.0–1.2)
CO2: 25 mmol/L (ref 20–29)
Calcium: 9.4 mg/dL (ref 8.6–10.2)
Chloride: 103 mmol/L (ref 96–106)
Creatinine, Ser: 1.14 mg/dL (ref 0.76–1.27)
GFR calc Af Amer: 68 mL/min/{1.73_m2} (ref >59)
GFR calc non Af Amer: 59 mL/min/{1.73_m2} — ABNORMAL LOW (ref >59)
GLOBULIN, TOTAL: 2.4 g/dL (ref 1.5–4.5)
Glucose: 108 mg/dL — ABNORMAL HIGH (ref 65–99)
POTASSIUM: 4.5 mmol/L (ref 3.5–5.2)
SODIUM: 142 mmol/L (ref 134–144)
Total Protein: 6.7 g/dL (ref 6.0–8.5)

## 2018-05-04 LAB — TSH: TSH: 2.72 u[IU]/mL (ref 0.450–4.500)

## 2018-05-04 LAB — T4, FREE: Free T4: 1.43 ng/dL (ref 0.82–1.77)

## 2018-07-07 DIAGNOSIS — R69 Illness, unspecified: Secondary | ICD-10-CM | POA: Diagnosis not present

## 2018-11-01 ENCOUNTER — Ambulatory Visit (INDEPENDENT_AMBULATORY_CARE_PROVIDER_SITE_OTHER): Payer: Medicare HMO | Admitting: Family Medicine

## 2018-11-01 ENCOUNTER — Encounter: Payer: Self-pay | Admitting: Family Medicine

## 2018-11-01 ENCOUNTER — Other Ambulatory Visit: Payer: Self-pay

## 2018-11-01 VITALS — BP 139/63 | HR 63 | Temp 97.8°F | Ht 70.0 in | Wt 155.2 lb

## 2018-11-01 DIAGNOSIS — E039 Hypothyroidism, unspecified: Secondary | ICD-10-CM | POA: Diagnosis not present

## 2018-11-01 DIAGNOSIS — E119 Type 2 diabetes mellitus without complications: Secondary | ICD-10-CM

## 2018-11-01 DIAGNOSIS — E782 Mixed hyperlipidemia: Secondary | ICD-10-CM | POA: Diagnosis not present

## 2018-11-01 LAB — BAYER DCA HB A1C WAIVED: HB A1C (BAYER DCA - WAIVED): 5.8 % (ref <7.0)

## 2018-11-01 MED ORDER — LEVOTHYROXINE SODIUM 75 MCG PO TABS: ORAL_TABLET | ORAL | 1 refills | Status: DC

## 2018-11-01 MED ORDER — METFORMIN HCL 500 MG PO TABS: ORAL_TABLET | ORAL | 1 refills | Status: DC

## 2018-11-01 MED ORDER — ATORVASTATIN CALCIUM 40 MG PO TABS: ORAL_TABLET | ORAL | 1 refills | Status: DC

## 2018-11-01 NOTE — Progress Notes (Signed)
Subjective:  Patient ID: Arthur Franklin,  male    DOB: 1932-07-08  Age: 85 y.o.    CC: Medical Management of Chronic Issues   HPI Arthur Franklin presents for patient presents for follow-up on  thyroid. The patient has a history of hypothyroidism for many years. It has been stable recently. Pt. denies any change in  voice, loss of hair, heat or cold intolerance. Energy level has been adequate to good. Patient denies constipation and diarrhea. No myxedema. Medication is as noted below. Verified that pt is taking it daily on an empty stomach. Well tolerated. Patient also  in for follow-up of elevated cholesterol. Doing well without complaints on current medication. Denies side effects  including myalgia and arthralgia and nausea. Also in today for liver function testing. Currently no chest pain, shortness of breath or other cardiovascular related symptoms noted.  Follow-up of diabetes.Patient does not now and does not intend to start checking his blood sugar at home.  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  He is trying to staying away from crowds to avoid the coronavirus.Patient denies symptoms such as excessive hunger or urinary frequency, excessive hunger, nausea No significant hypoglycemic spells noted. Medications reviewed. Pt reports taking them regularly. Pt. denies complication/adverse reaction today.    History Arthur Franklin has a past medical history of Diabetes mellitus without complication ([place]), Hyperlipidemia, and Thyroid disease.   He has no past surgical history on file.   His family history is not on file.He reports that he has never smoked. He has never used smokeless tobacco. He reports that he does not drink alcohol or use drugs.  No current outpatient medications on file prior to visit.   No current facility-administered medications on file prior to visit.     ROS Review of Systems  Constitutional: Negative.   HENT: Negative.    Eyes: Negative for visual disturbance.  Respiratory: Negative for cough and shortness of breath.   Cardiovascular: Negative for chest pain and leg swelling.  Gastrointestinal: Negative for abdominal pain, diarrhea, nausea and vomiting.  Genitourinary: Negative for difficulty urinating.  Musculoskeletal: Negative for arthralgias and myalgias.  Skin: Negative for rash.  Neurological: Negative for headaches.  Psychiatric/Behavioral: Negative for sleep disturbance.    Objective:  BP 139/63   Pulse 63   Temp 97.8 F (36.6 C) (Oral)   Ht '5\' 10"'  (1.778 m)   Wt 155 lb 4 oz (70.4 kg)   BMI 22.28 kg/m   BP Readings from Last 3 Encounters:  11/01/18 139/63  05/03/18 124/75  01/26/18 137/62    Wt Readings from Last 3 Encounters:  11/01/18 155 lb 4 oz (70.4 kg)  05/03/18 156 lb (70.8 kg)  01/26/18 158 lb 2 oz (71.7 kg)     Physical Exam Constitutional:      General: He is not in acute distress.    Appearance: He is well-developed.  HENT:     Head: Normocephalic and atraumatic.     Right Ear: External ear normal.     Left Ear: External ear normal.     Nose: Nose normal.  Eyes:     Conjunctiva/sclera: Conjunctivae normal.     Pupils: Pupils are equal, round, and reactive to light.  Neck:     Musculoskeletal: Normal range of motion and neck supple.  Cardiovascular:     Rate and Rhythm: Normal rate and regular rhythm.     Heart sounds: Normal heart sounds. No murmur.  Pulmonary:     Effort: Pulmonary effort is normal. No respiratory distress.     Breath sounds: Normal breath sounds. No wheezing or rales.  Abdominal:     Palpations: Abdomen is soft.     Tenderness: There is no abdominal tenderness.  Musculoskeletal: Normal range of motion.  Skin:    General: Skin is warm and dry.  Neurological:     Mental Status: He is alert and oriented to person, place, and time.     Deep Tendon Reflexes: Reflexes are normal and symmetric.  Psychiatric:        Behavior: Behavior  normal.        Thought Content: Thought content normal.        Judgment: Judgment normal.     Diabetic Foot Exam - Simple   No data filed        Assessment & Plan:   Arthur Franklin was seen today for medical management of chronic issues.  Diagnoses and all orders for this visit:  Type 2 diabetes mellitus without complication, without long-term current use of insulin (HCC) -     Bayer DCA Hb A1c Waived  Mixed hyperlipidemia -     CBC with Differential/Platelet -     CMP14+EGFR -     Lipid panel  Hypothyroidism, unspecified type -     TSH -     T4, Free  Other orders -     atorvastatin (LIPITOR) 40 MG tablet; TAKE 1 TABLET BY MOUTH ONCE DAILY -     levothyroxine (SYNTHROID, LEVOTHROID) 75 MCG tablet; TAKE 1 TABLET BY MOUTH ONCE DAILY BEFORE BREAKFAST -     metFORMIN (GLUCOPHAGE) 500 MG tablet; TAKE 1 TABLET BY MOUTH ONCE DAILY WITH BREAKFAST   I have changed Arthur Franklin's atorvastatin. I am also having him maintain his levothyroxine and metFORMIN.  Meds ordered this encounter  Medications  . atorvastatin (LIPITOR) 40 MG tablet    Sig: TAKE 1 TABLET BY MOUTH ONCE DAILY    Dispense:  90 tablet    Refill:  1  . levothyroxine (SYNTHROID, LEVOTHROID) 75 MCG tablet    Sig: TAKE 1 TABLET BY MOUTH ONCE DAILY BEFORE BREAKFAST    Dispense:  90 tablet    Refill:  1    Please consider 90 day supplies to promote better adherence  . metFORMIN (GLUCOPHAGE) 500 MG tablet    Sig: TAKE 1 TABLET BY MOUTH ONCE DAILY WITH BREAKFAST    Dispense:  90 tablet    Refill:  1     Follow-up: Return in about 6 months (around 05/04/2019).  Claretta Fraise, M.D.

## 2018-11-02 LAB — CBC WITH DIFFERENTIAL/PLATELET
BASOS ABS: 0.1 10*3/uL (ref 0.0–0.2)
Basos: 1 %
EOS (ABSOLUTE): 0.6 10*3/uL — ABNORMAL HIGH (ref 0.0–0.4)
EOS: 7 %
Hematocrit: 42.4 % (ref 37.5–51.0)
Hemoglobin: 14.2 g/dL (ref 13.0–17.7)
Immature Grans (Abs): 0 10*3/uL (ref 0.0–0.1)
Immature Granulocytes: 0 %
LYMPHS ABS: 2.6 10*3/uL (ref 0.7–3.1)
Lymphs: 30 %
MCH: 31.9 pg (ref 26.6–33.0)
MCHC: 33.5 g/dL (ref 31.5–35.7)
MCV: 95 fL (ref 79–97)
MONOS ABS: 0.9 10*3/uL (ref 0.1–0.9)
Monocytes: 10 %
NEUTROS PCT: 52 %
Neutrophils Absolute: 4.5 10*3/uL (ref 1.4–7.0)
Platelets: 234 10*3/uL (ref 150–450)
RBC: 4.45 x10E6/uL (ref 4.14–5.80)
RDW: 12.4 % (ref 11.6–15.4)
WBC: 8.7 10*3/uL (ref 3.4–10.8)

## 2018-11-02 LAB — LIPID PANEL
CHOLESTEROL TOTAL: 122 mg/dL (ref 100–199)
Chol/HDL Ratio: 2.6 ratio (ref 0.0–5.0)
HDL: 47 mg/dL (ref >39)
LDL CALC: 61 mg/dL (ref 0–99)
Triglycerides: 69 mg/dL (ref 0–149)
VLDL Cholesterol Cal: 14 mg/dL (ref 5–40)

## 2018-11-02 LAB — CMP14+EGFR
ALK PHOS: 90 IU/L (ref 39–117)
ALT: 15 IU/L (ref 0–44)
AST: 21 IU/L (ref 0–40)
Albumin/Globulin Ratio: 1.7 (ref 1.2–2.2)
Albumin: 4.4 g/dL (ref 3.6–4.6)
BUN/Creatinine Ratio: 16 (ref 10–24)
BUN: 16 mg/dL (ref 8–27)
Bilirubin Total: 0.5 mg/dL (ref 0.0–1.2)
CALCIUM: 9.4 mg/dL (ref 8.6–10.2)
CO2: 24 mmol/L (ref 20–29)
CREATININE: 1.02 mg/dL (ref 0.76–1.27)
Chloride: 103 mmol/L (ref 96–106)
GFR calc Af Amer: 78 mL/min/{1.73_m2} (ref >59)
GFR, EST NON AFRICAN AMERICAN: 67 mL/min/{1.73_m2} (ref >59)
GLOBULIN, TOTAL: 2.6 g/dL (ref 1.5–4.5)
Glucose: 93 mg/dL (ref 65–99)
Potassium: 4.5 mmol/L (ref 3.5–5.2)
SODIUM: 142 mmol/L (ref 134–144)
Total Protein: 7 g/dL (ref 6.0–8.5)

## 2018-11-02 LAB — T4, FREE: Free T4: 1.52 ng/dL (ref 0.82–1.77)

## 2018-11-02 LAB — TSH: TSH: 1.54 u[IU]/mL (ref 0.450–4.500)

## 2018-11-02 NOTE — Progress Notes (Signed)
Hello Arthur Franklin,  Your lab result is normal.Some minor variations that are not significant are commonly marked abnormal, but do not represent any medical problem for you.  Best regards, Claretta Fraise, M.D.

## 2019-05-03 ENCOUNTER — Ambulatory Visit (INDEPENDENT_AMBULATORY_CARE_PROVIDER_SITE_OTHER): Payer: Medicare HMO | Admitting: Family Medicine

## 2019-05-03 ENCOUNTER — Encounter: Payer: Self-pay | Admitting: Family Medicine

## 2019-05-03 DIAGNOSIS — E119 Type 2 diabetes mellitus without complications: Secondary | ICD-10-CM | POA: Diagnosis not present

## 2019-05-03 DIAGNOSIS — E039 Hypothyroidism, unspecified: Secondary | ICD-10-CM | POA: Diagnosis not present

## 2019-05-03 MED ORDER — ATORVASTATIN CALCIUM 40 MG PO TABS: ORAL_TABLET | ORAL | 1 refills | Status: DC

## 2019-05-03 MED ORDER — LEVOTHYROXINE SODIUM 75 MCG PO TABS: ORAL_TABLET | ORAL | 1 refills | Status: DC

## 2019-05-03 MED ORDER — METFORMIN HCL 500 MG PO TABS: ORAL_TABLET | ORAL | 1 refills | Status: DC

## 2019-05-03 NOTE — Progress Notes (Signed)
Subjective:    Patient ID: Arthur Franklin, male    DOB: 1932/04/19, 85 y.o.   MRN: [id number]   HPI: Arthur Franklin is a 85 y.o. male presenting for  presents forFollow-up of diabetes. Patient checks blood sugar at home.  Patient denies symptoms such as polyuria, polydipsia, excessive hunger, nausea  No significant hypoglycemic spells noted. Medications reviewed. Pt reports taking them regularly without complication/adverse reaction being reported today.  Checking feet daily.  in for follow-up of elevated cholesterol. Doing well without complaints on current medication. Denies side effects of statin including myalgia and arthralgia and nausea. Currently no chest pain, shortness of breath or other cardiovascular related symptoms noted.     Depression screen [facility] 2/9 11/01/2018 05/03/2018 12/22/2016 09/19/2016 04/22/2016  Decreased Interest 0 0 0 0 0  Down, Depressed, Hopeless 0 0 0 0 0  PHQ - 2 Score 0 0 0 0 0     Relevant past medical, surgical, family and social history reviewed and updated as indicated.  Interim medical history since our last visit reviewed. Allergies and medications reviewed and updated.  ROS:  Review of Systems  Constitutional: Negative.   HENT: Negative.   Eyes: Negative for visual disturbance.  Respiratory: Negative for cough and shortness of breath.   Cardiovascular: Negative for chest pain and leg swelling.  Gastrointestinal: Negative for abdominal pain, diarrhea, nausea and vomiting.  Genitourinary: Negative for difficulty urinating and frequency.  Musculoskeletal: Negative for arthralgias and myalgias.  Skin: Negative for rash.  Neurological: Negative for headaches.  Psychiatric/Behavioral: Negative for sleep disturbance.     Social History   Tobacco Use  Smoking Status Never Smoker  Smokeless Tobacco Never Used       Objective:     Wt Readings from Last 3 Encounters:  11/01/18 155 lb 4 oz (70.4 kg)  05/03/18 156 lb (70.8 kg)  01/26/18  158 lb 2 oz (71.7 kg)     Exam deferred. Pt. Harboring due to COVID 19. Phone visit performed.   Assessment & Plan:   1. Type 2 diabetes mellitus without complication, without long-term current use of insulin ([place])   2. Hypothyroidism, unspecified type     Meds ordered this encounter  Medications  . atorvastatin (LIPITOR) 40 MG tablet    Sig: TAKE 1 TABLET BY MOUTH ONCE DAILY    Dispense:  90 tablet    Refill:  1  . levothyroxine (SYNTHROID) 75 MCG tablet    Sig: TAKE 1 TABLET BY MOUTH ONCE DAILY BEFORE BREAKFAST    Dispense:  90 tablet    Refill:  1    Please consider 90 day supplies to promote better adherence  . metFORMIN (GLUCOPHAGE) 500 MG tablet    Sig: TAKE 1 TABLET BY MOUTH ONCE DAILY WITH BREAKFAST    Dispense:  90 tablet    Refill:  1    No orders of the defined types were placed in this encounter.     Diagnoses and all orders for this visit:  Type 2 diabetes mellitus without complication, without long-term current use of insulin (HCC)  Hypothyroidism, unspecified type  Other orders -     atorvastatin (LIPITOR) 40 MG tablet; TAKE 1 TABLET BY MOUTH ONCE DAILY -     levothyroxine (SYNTHROID) 75 MCG tablet; TAKE 1 TABLET BY MOUTH ONCE DAILY BEFORE BREAKFAST -     metFORMIN (GLUCOPHAGE) 500 MG tablet; TAKE 1 TABLET BY MOUTH ONCE DAILY WITH BREAKFAST    Virtual Visit via telephone  Note  I discussed the limitations, risks, security and privacy concerns of performing an evaluation and management service by telephone and the availability of in person appointments. The patient was identified with two identifiers. Pt.expressed understanding and agreed to proceed. Pt. Is at home. Dr. Livia Snellen is in his office.  Follow Up Instructions:   I discussed the assessment and treatment plan with the patient. The patient was provided an opportunity to ask questions and all were answered. The patient agreed with the plan and demonstrated an understanding of the instructions.    The patient was advised to call back or seek an in-person evaluation if the symptoms worsen or if the condition fails to improve as anticipated.   Total minutes including chart review and phone contact time: 23   Follow up plan: Return in about 6 months (around 10/31/2019) for Hypothyroidism, diabetes, cholesterol.  Claretta Fraise, MD [place]

## 2019-05-04 ENCOUNTER — Ambulatory Visit: Payer: Medicare HMO | Admitting: Family Medicine

## 2019-06-07 DIAGNOSIS — D485 Neoplasm of uncertain behavior of skin: Secondary | ICD-10-CM | POA: Diagnosis not present

## 2019-06-07 DIAGNOSIS — L080 Pyoderma: Secondary | ICD-10-CM | POA: Diagnosis not present

## 2019-06-07 DIAGNOSIS — L570 Actinic keratosis: Secondary | ICD-10-CM | POA: Diagnosis not present

## 2019-06-07 DIAGNOSIS — L821 Other seborrheic keratosis: Secondary | ICD-10-CM | POA: Diagnosis not present

## 2019-06-07 DIAGNOSIS — C44319 Basal cell carcinoma of skin of other parts of face: Secondary | ICD-10-CM | POA: Diagnosis not present

## 2019-06-14 DIAGNOSIS — R69 Illness, unspecified: Secondary | ICD-10-CM | POA: Diagnosis not present

## 2019-06-16 DIAGNOSIS — C44311 Basal cell carcinoma of skin of nose: Secondary | ICD-10-CM | POA: Diagnosis not present

## 2019-11-09 ENCOUNTER — Other Ambulatory Visit: Payer: Self-pay | Admitting: Family Medicine

## 2019-11-10 ENCOUNTER — Other Ambulatory Visit: Payer: Self-pay | Admitting: *Deleted

## 2019-12-14 ENCOUNTER — Other Ambulatory Visit: Payer: Self-pay | Admitting: Family Medicine

## 2019-12-14 DIAGNOSIS — L570 Actinic keratosis: Secondary | ICD-10-CM | POA: Diagnosis not present

## 2019-12-14 NOTE — Telephone Encounter (Signed)
Stacks. NTBS 30 days given 11/09/19

## 2019-12-16 NOTE — Telephone Encounter (Signed)
Has upcoming apt  

## 2019-12-19 ENCOUNTER — Encounter: Payer: Self-pay | Admitting: Family Medicine

## 2019-12-19 ENCOUNTER — Ambulatory Visit (INDEPENDENT_AMBULATORY_CARE_PROVIDER_SITE_OTHER): Payer: Medicare HMO | Admitting: Family Medicine

## 2019-12-19 DIAGNOSIS — N3090 Cystitis, unspecified without hematuria: Secondary | ICD-10-CM | POA: Diagnosis not present

## 2019-12-19 DIAGNOSIS — E119 Type 2 diabetes mellitus without complications: Secondary | ICD-10-CM

## 2019-12-19 DIAGNOSIS — E782 Mixed hyperlipidemia: Secondary | ICD-10-CM

## 2019-12-19 DIAGNOSIS — E039 Hypothyroidism, unspecified: Secondary | ICD-10-CM

## 2019-12-19 MED ORDER — CIPROFLOXACIN HCL 500 MG PO TABS: 500.0000 mg | ORAL_TABLET | Freq: Two times a day (BID) | ORAL | 0 refills | Status: DC

## 2019-12-19 MED ORDER — METFORMIN HCL 500 MG PO TABS: ORAL_TABLET | ORAL | 1 refills | Status: DC

## 2019-12-19 MED ORDER — LEVOTHYROXINE SODIUM 75 MCG PO TABS: ORAL_TABLET | ORAL | 1 refills | Status: DC

## 2019-12-19 MED ORDER — ATORVASTATIN CALCIUM 40 MG PO TABS: ORAL_TABLET | ORAL | 1 refills | Status: DC

## 2019-12-19 NOTE — Progress Notes (Signed)
Subjective:  Patient ID: Arthur Franklin, male    DOB: 1931/10/01  Age: 86 y.o. MRN: [id number]  CC: No chief complaint on file.   HPI Twan Harkin presents for  follow-up on  thyroid. The patient has a history of hypothyroidism for many years. It has been stable recently. Pt. denies any change in  voice, loss of hair, heat or cold intolerance. Energy level has been adequate to good. Patient denies constipation and diarrhea. No myxedema. Medication is as noted below. Verified that pt is taking it daily on an empty stomach. Well tolerated.  Patient in for follow-up of elevated cholesterol. Doing well without complaints on current medication. Denies side effects of statin including myalgia and arthralgia and nausea. Also in today for liver function testing. Currently no chest pain, shortness of breath or other cardiovascular related symptoms noted.  Patient not checking his blood sugar but he is taking his Metformin.  He mentions that he is having some strong smelling urine and urinary frequency with urge incontinence over the last couple of days.  However he is not having polyphagia or polydipsia.  No dysuria noted either.  No fever chills or sweats.    Depression screen [facility] 2/9 11/01/2018 05/03/2018 12/22/2016  Decreased Interest 0 0 0  Down, Depressed, Hopeless 0 0 0  PHQ - 2 Score 0 0 0    History Giavanni has a past medical history of Diabetes mellitus without complication ([place]), Hyperlipidemia, and Thyroid disease.   He has no past surgical history on file.   His family history is not on file.He reports that he has never smoked. He has never used smokeless tobacco. He reports that he does not drink alcohol or use drugs.    ROS Review of Systems  Constitutional: Negative.   HENT: Negative.   Eyes: Negative for visual disturbance.  Respiratory: Negative for cough and shortness of breath.   Cardiovascular: Negative for chest pain and leg swelling.  Gastrointestinal: Negative for  abdominal pain, diarrhea, nausea and vomiting.  Genitourinary: Positive for frequency. Negative for difficulty urinating.  Musculoskeletal: Negative for arthralgias and myalgias.  Skin: Negative for rash.  Neurological: Negative for headaches.  Psychiatric/Behavioral: Negative for sleep disturbance.    Objective:  There were no vitals taken for this visit.  BP Readings from Last 3 Encounters:  11/01/18 139/63  05/03/18 124/75  01/26/18 137/62    Wt Readings from Last 3 Encounters:  11/01/18 155 lb 4 oz (70.4 kg)  05/03/18 156 lb (70.8 kg)  01/26/18 158 lb 2 oz (71.7 kg)     Physical Exam  Exam deferred. Pt. Harboring due to COVID 19. Phone visit performed.   Assessment & Plan:   Diagnoses and all orders for this visit:  Type 2 diabetes mellitus without complication, without long-term current use of insulin (HCC) -     CMP14+EGFR -     metFORMIN (GLUCOPHAGE) 500 MG tablet; TAKE 1 TABLET BY MOUTH ONCE DAILY WITH BREAKFAST  Mixed hyperlipidemia -     CMP14+EGFR -     Lipid panel -     atorvastatin (LIPITOR) 40 MG tablet; TAKE 1 TABLET BY MOUTH ONCE DAILY  Hypothyroidism, unspecified type -     CBC with Differential/Platelet -     CMP14+EGFR -     TSH + free T4 -     levothyroxine (SYNTHROID) 75 MCG tablet; TAKE 1 TABLET BY MOUTH ONCE DAILY BEFORE BREAKFAST.  Needs to be seen for further refills.  Cystitis -  CBC with Differential/Platelet -     ciprofloxacin (CIPRO) 500 MG tablet; Take 1 tablet (500 mg total) by mouth 2 (two) times daily.     Patient stable with regard to chronic diagnoses listed above.  His only new symptom is related to some cystitis.  He will drop by for blood work so we can check on his blood sugar.  Sounds more like his 86 year old prostate is causing the problem as opposed to his diabetes.  I am having Eldon L. Blanch Media start on ciprofloxacin. I am also having him maintain his atorvastatin, levothyroxine, and metFORMIN.  Allergies as of  12/19/2019   No Known Allergies     Medication List       Accurate as of Dec 19, 2019  8:28 AM. If you have any questions, ask your nurse or doctor.        atorvastatin 40 MG tablet Commonly known as: LIPITOR TAKE 1 TABLET BY MOUTH ONCE DAILY   ciprofloxacin 500 MG tablet Commonly known as: Cipro Take 1 tablet (500 mg total) by mouth 2 (two) times daily.   levothyroxine 75 MCG tablet Commonly known as: SYNTHROID TAKE 1 TABLET BY MOUTH ONCE DAILY BEFORE BREAKFAST.  Needs to be seen for further refills.   metFORMIN 500 MG tablet Commonly known as: GLUCOPHAGE TAKE 1 TABLET BY MOUTH ONCE DAILY WITH BREAKFAST      Virtual Visit via telephone Note  I discussed the limitations, risks, security and privacy concerns of performing an evaluation and management service by telephone and the availability of in person appointments. I also discussed with the patient that there may be a patient responsible charge related to this service. The patient expressed understanding and agreed to proceed. Pt. Is at home. Dr. Livia Snellen is in his office.  Follow Up Instructions:   I discussed the assessment and treatment plan with the patient. The patient was provided an opportunity to ask questions and all were answered. The patient agreed with the plan and demonstrated an understanding of the instructions.   The patient was advised to call back or seek an in-person evaluation if the symptoms worsen or if the condition fails to improve as anticipated.  Total minutes including chart review and phone contact time: 17   Follow-up: No follow-ups on file.  Claretta Fraise, M.D.

## 2019-12-20 ENCOUNTER — Other Ambulatory Visit: Payer: Self-pay

## 2019-12-20 ENCOUNTER — Other Ambulatory Visit: Payer: Medicare HMO

## 2019-12-20 DIAGNOSIS — E119 Type 2 diabetes mellitus without complications: Secondary | ICD-10-CM | POA: Diagnosis not present

## 2019-12-20 DIAGNOSIS — N3090 Cystitis, unspecified without hematuria: Secondary | ICD-10-CM | POA: Diagnosis not present

## 2019-12-20 DIAGNOSIS — E039 Hypothyroidism, unspecified: Secondary | ICD-10-CM | POA: Diagnosis not present

## 2019-12-20 DIAGNOSIS — E782 Mixed hyperlipidemia: Secondary | ICD-10-CM | POA: Diagnosis not present

## 2019-12-20 LAB — BAYER DCA HB A1C WAIVED: HB A1C (BAYER DCA - WAIVED): 6.4 % (ref <7.0)

## 2019-12-21 ENCOUNTER — Other Ambulatory Visit: Payer: Self-pay | Admitting: Family Medicine

## 2019-12-21 ENCOUNTER — Telehealth: Payer: Self-pay | Admitting: Family Medicine

## 2019-12-21 DIAGNOSIS — E039 Hypothyroidism, unspecified: Secondary | ICD-10-CM

## 2019-12-21 LAB — CBC WITH DIFFERENTIAL/PLATELET
Basophils Absolute: 0.1 10*3/uL (ref 0.0–0.2)
Basos: 1 %
EOS (ABSOLUTE): 0.3 10*3/uL (ref 0.0–0.4)
Eos: 3 %
Hematocrit: 44.1 % (ref 37.5–51.0)
Hemoglobin: 14.4 g/dL (ref 13.0–17.7)
Immature Grans (Abs): 0 10*3/uL (ref 0.0–0.1)
Immature Granulocytes: 0 %
Lymphocytes Absolute: 1.2 10*3/uL (ref 0.7–3.1)
Lymphs: 13 %
MCH: 31.3 pg (ref 26.6–33.0)
MCHC: 32.7 g/dL (ref 31.5–35.7)
MCV: 96 fL (ref 79–97)
Monocytes Absolute: 0.9 10*3/uL (ref 0.1–0.9)
Monocytes: 9 %
Neutrophils Absolute: 7.1 10*3/uL — ABNORMAL HIGH (ref 1.4–7.0)
Neutrophils: 74 %
Platelets: 189 10*3/uL (ref 150–450)
RBC: 4.6 x10E6/uL (ref 4.14–5.80)
RDW: 12.4 % (ref 11.6–15.4)
WBC: 9.6 10*3/uL (ref 3.4–10.8)

## 2019-12-21 LAB — CMP14+EGFR
ALT: 14 IU/L (ref 0–44)
AST: 19 IU/L (ref 0–40)
Albumin/Globulin Ratio: 1.5 (ref 1.2–2.2)
Albumin: 4 g/dL (ref 3.6–4.6)
Alkaline Phosphatase: 89 IU/L (ref 39–117)
BUN/Creatinine Ratio: 16 (ref 10–24)
BUN: 19 mg/dL (ref 8–27)
Bilirubin Total: 0.8 mg/dL (ref 0.0–1.2)
CO2: 23 mmol/L (ref 20–29)
Calcium: 9.6 mg/dL (ref 8.6–10.2)
Chloride: 102 mmol/L (ref 96–106)
Creatinine, Ser: 1.18 mg/dL (ref 0.76–1.27)
GFR calc Af Amer: 65 mL/min/{1.73_m2} (ref >59)
GFR calc non Af Amer: 56 mL/min/{1.73_m2} — ABNORMAL LOW (ref >59)
Globulin, Total: 2.7 g/dL (ref 1.5–4.5)
Glucose: 113 mg/dL — ABNORMAL HIGH (ref 65–99)
Potassium: 4.4 mmol/L (ref 3.5–5.2)
Sodium: 140 mmol/L (ref 134–144)
Total Protein: 6.7 g/dL (ref 6.0–8.5)

## 2019-12-21 LAB — LIPID PANEL
Chol/HDL Ratio: 3.2 ratio (ref 0.0–5.0)
Cholesterol, Total: 114 mg/dL (ref 100–199)
HDL: 36 mg/dL — ABNORMAL LOW (ref >39)
LDL Chol Calc (NIH): 60 mg/dL (ref 0–99)
Triglycerides: 94 mg/dL (ref 0–149)
VLDL Cholesterol Cal: 18 mg/dL (ref 5–40)

## 2019-12-21 LAB — TSH+FREE T4
Free T4: 0.98 ng/dL (ref 0.82–1.77)
TSH: 5.22 u[IU]/mL — ABNORMAL HIGH (ref 0.450–4.500)

## 2019-12-21 MED ORDER — LEVOTHYROXINE SODIUM 88 MCG PO TABS: ORAL_TABLET | ORAL | 1 refills | Status: DC

## 2019-12-21 NOTE — Telephone Encounter (Signed)
Pt returned missed call from [facility] regarding lab results. Went over lab results with pt per Dr Livia Snellen notes. Pt voiced understanding and is aware that he needs to go to pharmacy to pick up new thyroid Rx with higher dose and to take as directed until he comes in for his next appt on 02/09/20.

## 2019-12-21 NOTE — Addendum Note (Signed)
Addended by: Christia Reading on: 12/21/2019 08:46 AM   Modules accepted: Orders

## 2020-02-09 ENCOUNTER — Ambulatory Visit: Payer: Medicare HMO | Admitting: Family Medicine

## 2020-06-12 DIAGNOSIS — Z85828 Personal history of other malignant neoplasm of skin: Secondary | ICD-10-CM | POA: Diagnosis not present

## 2020-06-12 DIAGNOSIS — L814 Other melanin hyperpigmentation: Secondary | ICD-10-CM | POA: Diagnosis not present

## 2020-06-12 DIAGNOSIS — L570 Actinic keratosis: Secondary | ICD-10-CM | POA: Diagnosis not present

## 2020-06-12 DIAGNOSIS — L821 Other seborrheic keratosis: Secondary | ICD-10-CM | POA: Diagnosis not present

## 2020-06-18 ENCOUNTER — Encounter: Payer: Self-pay | Admitting: *Deleted

## 2020-06-20 ENCOUNTER — Ambulatory Visit: Payer: Medicare HMO | Admitting: Family Medicine

## 2020-06-20 DIAGNOSIS — E119 Type 2 diabetes mellitus without complications: Secondary | ICD-10-CM

## 2020-06-20 DIAGNOSIS — E782 Mixed hyperlipidemia: Secondary | ICD-10-CM

## 2020-06-20 DIAGNOSIS — E039 Hypothyroidism, unspecified: Secondary | ICD-10-CM

## 2020-06-22 ENCOUNTER — Other Ambulatory Visit: Payer: Self-pay | Admitting: Family Medicine

## 2020-06-22 DIAGNOSIS — E039 Hypothyroidism, unspecified: Secondary | ICD-10-CM

## 2020-07-03 ENCOUNTER — Encounter: Payer: Self-pay | Admitting: Family Medicine

## 2020-07-03 ENCOUNTER — Other Ambulatory Visit: Payer: Self-pay

## 2020-07-03 ENCOUNTER — Ambulatory Visit (INDEPENDENT_AMBULATORY_CARE_PROVIDER_SITE_OTHER): Payer: Medicare HMO | Admitting: Family Medicine

## 2020-07-03 VITALS — BP 132/73 | HR 53 | Temp 97.0°F | Ht 70.0 in | Wt 152.8 lb

## 2020-07-03 DIAGNOSIS — E782 Mixed hyperlipidemia: Secondary | ICD-10-CM

## 2020-07-03 DIAGNOSIS — Z7689 Persons encountering health services in other specified circumstances: Secondary | ICD-10-CM | POA: Diagnosis not present

## 2020-07-03 DIAGNOSIS — E119 Type 2 diabetes mellitus without complications: Secondary | ICD-10-CM

## 2020-07-03 DIAGNOSIS — E039 Hypothyroidism, unspecified: Secondary | ICD-10-CM | POA: Diagnosis not present

## 2020-07-03 LAB — BAYER DCA HB A1C WAIVED: HB A1C (BAYER DCA - WAIVED): 6.1 % (ref <7.0)

## 2020-07-03 MED ORDER — ATORVASTATIN CALCIUM 40 MG PO TABS: ORAL_TABLET | ORAL | 1 refills | Status: DC

## 2020-07-03 MED ORDER — METFORMIN HCL 500 MG PO TABS: ORAL_TABLET | ORAL | 1 refills | Status: DC

## 2020-07-03 MED ORDER — LEVOTHYROXINE SODIUM 88 MCG PO TABS: ORAL_TABLET | ORAL | 1 refills | Status: DC

## 2020-07-03 NOTE — Progress Notes (Signed)
Subjective:  Patient ID: Arthur Franklin,  male    DOB: 1932-08-12  Age: 86 y.o.    CC: Follow-up   HPI Arthur Franklin presents for  follow-up of  follow-up on  thyroid. The patient has a history of hypothyroidism for many years. It has been stable recently. Pt. denies any change in  voice, loss of hair, heat or cold intolerance. Energy level has been adequate to good. Patient denies constipation and diarrhea. No myxedema. Medication is as noted below. Verified that pt is taking it daily on an empty stomach. Well tolerated.  Patient also  in for follow-up of elevated cholesterol. Doing well without complaints on current medication. Denies side effects  including myalgia and arthralgia and nausea. Also in today for liver function testing. Currently no chest pain, shortness of breath or other cardiovascular related symptoms noted.  DM F?U Patient denies symptoms such as excessive hunger or urinary frequency, excessive hunger, nausea No significant hypoglycemic spells noted. Medications reviewed. Pt reports taking them regularly. Pt. denies complication/adverse reaction today.    History Arthur Franklin has a past medical history of Diabetes mellitus without complication ([place]), Hyperlipidemia, and Thyroid disease.   He has no past surgical history on file.   His family history is not on file.He reports that he has never smoked. He has never used smokeless tobacco. He reports that he does not drink alcohol and does not use drugs.  No current outpatient medications on file prior to visit.   No current facility-administered medications on file prior to visit.    ROS Review of Systems  Constitutional: Negative for fever.  Respiratory: Negative for shortness of breath.   Cardiovascular: Negative for chest pain.  Musculoskeletal: Negative for arthralgias.  Skin: Negative for rash.    Objective:  BP 132/73   Pulse (!) 53   Temp (!) 97 F (36.1 C) (Temporal)   Ht _0  (1.778 m)   Wt  152 lb 12.8 oz (69.3 kg)   BMI 21.92 kg/m   BP Readings from Last 3 Encounters:  07/03/20 132/73  11/01/18 139/63  05/03/18 124/75    Wt Readings from Last 3 Encounters:  07/03/20 152 lb 12.8 oz (69.3 kg)  11/01/18 155 lb 4 oz (70.4 kg)  05/03/18 156 lb (70.8 kg)     Physical Exam Vitals reviewed.  Constitutional:      Appearance: He is well-developed.  HENT:     Head: Normocephalic and atraumatic.     Right Ear: Tympanic membrane and external ear normal. No decreased hearing noted.     Left Ear: Tympanic membrane and external ear normal. No decreased hearing noted.     Mouth/Throat:     Pharynx: No oropharyngeal exudate or posterior oropharyngeal erythema.  Eyes:     Pupils: Pupils are equal, round, and reactive to light.  Cardiovascular:     Rate and Rhythm: Normal rate and regular rhythm.     Heart sounds: No murmur heard.   Pulmonary:     Effort: No respiratory distress.     Breath sounds: Normal breath sounds.  Abdominal:     General: Bowel sounds are normal.     Palpations: Abdomen is soft. There is no mass.     Tenderness: There is no abdominal tenderness.  Musculoskeletal:     Cervical back: Normal range of motion and neck supple.     Diabetic Foot Exam - Simple   No data filed        Assessment &  Plan:   Arthur Franklin was seen today for follow-up.  Diagnoses and all orders for this visit:  Type 2 diabetes mellitus without complication, without long-term current use of insulin (HCC) -     CBC with Differential/Platelet -     CMP14+EGFR -     Lipid panel -     Bayer DCA Hb A1c Waived -     Microalbumin / creatinine urine ratio -     metFORMIN (GLUCOPHAGE) 500 MG tablet; TAKE 1 TABLET BY MOUTH ONCE DAILY WITH BREAKFAST  Hypothyroidism, unspecified type -     CBC with Differential/Platelet -     CMP14+EGFR -     Lipid panel -     Thyroid Panel With TSH -     levothyroxine (SYNTHROID) 88 MCG tablet; TAKE 1 TABLET BY MOUTH ONCE DAILY BEFORE  BREAKFAST  Mixed hyperlipidemia -     CBC with Differential/Platelet -     CMP14+EGFR -     Lipid panel -     Bayer DCA Hb A1c Waived -     atorvastatin (LIPITOR) 40 MG tablet; TAKE 1 TABLET BY MOUTH ONCE DAILY   I have discontinued Arthur Franklin's ciprofloxacin. I have also changed his levothyroxine. Additionally, I am having him maintain his metFORMIN and atorvastatin.  Meds ordered this encounter  Medications  . metFORMIN (GLUCOPHAGE) 500 MG tablet    Sig: TAKE 1 TABLET BY MOUTH ONCE DAILY WITH BREAKFAST    Dispense:  90 tablet    Refill:  1  . levothyroxine (SYNTHROID) 88 MCG tablet    Sig: TAKE 1 TABLET BY MOUTH ONCE DAILY BEFORE BREAKFAST    Dispense:  90 tablet    Refill:  1  . atorvastatin (LIPITOR) 40 MG tablet    Sig: TAKE 1 TABLET BY MOUTH ONCE DAILY    Dispense:  90 tablet    Refill:  1     Follow-up: Return in about 3 months (around 10/03/2020).  Claretta Fraise, M.D.

## 2020-07-04 LAB — CMP14+EGFR
ALT: 19 IU/L (ref 0–44)
AST: 20 IU/L (ref 0–40)
Albumin/Globulin Ratio: 1.8 (ref 1.2–2.2)
Albumin: 4.1 g/dL (ref 3.6–4.6)
Alkaline Phosphatase: 78 IU/L (ref 44–121)
BUN/Creatinine Ratio: 13 (ref 10–24)
BUN: 12 mg/dL (ref 8–27)
Bilirubin Total: 0.3 mg/dL (ref 0.0–1.2)
CO2: 24 mmol/L (ref 20–29)
Calcium: 9.3 mg/dL (ref 8.6–10.2)
Chloride: 104 mmol/L (ref 96–106)
Creatinine, Ser: 0.92 mg/dL (ref 0.76–1.27)
GFR calc Af Amer: 87 mL/min/{1.73_m2} (ref >59)
GFR calc non Af Amer: 75 mL/min/{1.73_m2} (ref >59)
Globulin, Total: 2.3 g/dL (ref 1.5–4.5)
Glucose: 120 mg/dL — ABNORMAL HIGH (ref 65–99)
Potassium: 4.5 mmol/L (ref 3.5–5.2)
Sodium: 141 mmol/L (ref 134–144)
Total Protein: 6.4 g/dL (ref 6.0–8.5)

## 2020-07-04 LAB — CBC WITH DIFFERENTIAL/PLATELET
Basophils Absolute: 0.1 10*3/uL (ref 0.0–0.2)
Basos: 1 %
EOS (ABSOLUTE): 0.4 10*3/uL (ref 0.0–0.4)
Eos: 6 %
Hematocrit: 40.9 % (ref 37.5–51.0)
Hemoglobin: 13.3 g/dL (ref 13.0–17.7)
Immature Grans (Abs): 0 10*3/uL (ref 0.0–0.1)
Immature Granulocytes: 0 %
Lymphocytes Absolute: 2.5 10*3/uL (ref 0.7–3.1)
Lymphs: 32 %
MCH: 31 pg (ref 26.6–33.0)
MCHC: 32.5 g/dL (ref 31.5–35.7)
MCV: 95 fL (ref 79–97)
Monocytes Absolute: 1.1 10*3/uL — ABNORMAL HIGH (ref 0.1–0.9)
Monocytes: 14 %
Neutrophils Absolute: 3.7 10*3/uL (ref 1.4–7.0)
Neutrophils: 47 %
Platelets: 196 10*3/uL (ref 150–450)
RBC: 4.29 x10E6/uL (ref 4.14–5.80)
RDW: 12.7 % (ref 11.6–15.4)
WBC: 7.8 10*3/uL (ref 3.4–10.8)

## 2020-07-04 LAB — LIPID PANEL
Chol/HDL Ratio: 2.4 ratio (ref 0.0–5.0)
Cholesterol, Total: 113 mg/dL (ref 100–199)
HDL: 47 mg/dL (ref >39)
LDL Chol Calc (NIH): 52 mg/dL (ref 0–99)
Triglycerides: 62 mg/dL (ref 0–149)
VLDL Cholesterol Cal: 14 mg/dL (ref 5–40)

## 2020-07-04 LAB — THYROID PANEL WITH TSH
Free Thyroxine Index: 2 (ref 1.2–4.9)
T3 Uptake Ratio: 28 % (ref 24–39)
T4, Total: 7.3 ug/dL (ref 4.5–12.0)
TSH: 4.87 u[IU]/mL — ABNORMAL HIGH (ref 0.450–4.500)

## 2020-07-05 DIAGNOSIS — R030 Elevated blood-pressure reading, without diagnosis of hypertension: Secondary | ICD-10-CM | POA: Diagnosis not present

## 2020-07-05 DIAGNOSIS — Z85828 Personal history of other malignant neoplasm of skin: Secondary | ICD-10-CM | POA: Diagnosis not present

## 2020-07-05 DIAGNOSIS — E039 Hypothyroidism, unspecified: Secondary | ICD-10-CM | POA: Diagnosis not present

## 2020-07-05 DIAGNOSIS — N529 Male erectile dysfunction, unspecified: Secondary | ICD-10-CM | POA: Diagnosis not present

## 2020-07-05 DIAGNOSIS — Z7984 Long term (current) use of oral hypoglycemic drugs: Secondary | ICD-10-CM | POA: Diagnosis not present

## 2020-07-05 DIAGNOSIS — E785 Hyperlipidemia, unspecified: Secondary | ICD-10-CM | POA: Diagnosis not present

## 2020-07-05 DIAGNOSIS — Z87891 Personal history of nicotine dependence: Secondary | ICD-10-CM | POA: Diagnosis not present

## 2020-07-05 DIAGNOSIS — E119 Type 2 diabetes mellitus without complications: Secondary | ICD-10-CM | POA: Diagnosis not present

## 2020-07-05 DIAGNOSIS — K08109 Complete loss of teeth, unspecified cause, unspecified class: Secondary | ICD-10-CM | POA: Diagnosis not present

## 2020-07-06 LAB — T4, FREE: Free T4: 1.24 ng/dL (ref 0.82–1.77)

## 2020-07-06 LAB — SPECIMEN STATUS REPORT

## 2020-07-08 NOTE — Progress Notes (Signed)
Hello Arthur Franklin,  Your lab result is normal and/or stable.Some minor variations that are not significant are commonly marked abnormal, but do not represent any medical problem for you.  Best regards, Adaira Centola, M.D.

## 2020-07-17 DIAGNOSIS — R69 Illness, unspecified: Secondary | ICD-10-CM | POA: Diagnosis not present

## 2020-07-24 ENCOUNTER — Other Ambulatory Visit: Payer: Self-pay | Admitting: Family Medicine

## 2020-07-24 DIAGNOSIS — E119 Type 2 diabetes mellitus without complications: Secondary | ICD-10-CM

## 2020-09-20 ENCOUNTER — Other Ambulatory Visit: Payer: Self-pay | Admitting: Family Medicine

## 2020-09-20 DIAGNOSIS — E039 Hypothyroidism, unspecified: Secondary | ICD-10-CM

## 2020-12-11 DIAGNOSIS — Z1283 Encounter for screening for malignant neoplasm of skin: Secondary | ICD-10-CM | POA: Diagnosis not present

## 2020-12-11 DIAGNOSIS — Z85828 Personal history of other malignant neoplasm of skin: Secondary | ICD-10-CM | POA: Diagnosis not present

## 2020-12-11 DIAGNOSIS — L821 Other seborrheic keratosis: Secondary | ICD-10-CM | POA: Diagnosis not present

## 2020-12-11 DIAGNOSIS — L570 Actinic keratosis: Secondary | ICD-10-CM | POA: Diagnosis not present

## 2021-01-05 ENCOUNTER — Other Ambulatory Visit: Payer: Self-pay | Admitting: Family Medicine

## 2021-01-05 DIAGNOSIS — E782 Mixed hyperlipidemia: Secondary | ICD-10-CM

## 2021-01-25 ENCOUNTER — Other Ambulatory Visit: Payer: Self-pay | Admitting: Family Medicine

## 2021-01-25 DIAGNOSIS — E119 Type 2 diabetes mellitus without complications: Secondary | ICD-10-CM

## 2021-02-05 ENCOUNTER — Other Ambulatory Visit: Payer: Self-pay | Admitting: Family Medicine

## 2021-02-05 DIAGNOSIS — E782 Mixed hyperlipidemia: Secondary | ICD-10-CM

## 2021-02-05 NOTE — Telephone Encounter (Signed)
Called patient, not available

## 2021-02-05 NOTE — Telephone Encounter (Signed)
Stacks. NTBS 30 days given 01/07/21

## 2021-02-06 NOTE — Telephone Encounter (Signed)
CALLED PATIENT, NOT AVAILABLE

## 2021-02-25 ENCOUNTER — Other Ambulatory Visit: Payer: Self-pay | Admitting: *Deleted

## 2021-02-25 DIAGNOSIS — E119 Type 2 diabetes mellitus without complications: Secondary | ICD-10-CM

## 2021-02-25 DIAGNOSIS — E782 Mixed hyperlipidemia: Secondary | ICD-10-CM

## 2021-02-25 DIAGNOSIS — E039 Hypothyroidism, unspecified: Secondary | ICD-10-CM

## 2021-02-26 ENCOUNTER — Other Ambulatory Visit: Payer: Self-pay | Admitting: Family Medicine

## 2021-02-26 DIAGNOSIS — E119 Type 2 diabetes mellitus without complications: Secondary | ICD-10-CM

## 2021-02-26 DIAGNOSIS — E782 Mixed hyperlipidemia: Secondary | ICD-10-CM

## 2021-02-28 ENCOUNTER — Other Ambulatory Visit: Payer: Self-pay | Admitting: Family Medicine

## 2021-02-28 DIAGNOSIS — E119 Type 2 diabetes mellitus without complications: Secondary | ICD-10-CM

## 2021-03-04 ENCOUNTER — Other Ambulatory Visit: Payer: Self-pay

## 2021-03-04 ENCOUNTER — Ambulatory Visit (INDEPENDENT_AMBULATORY_CARE_PROVIDER_SITE_OTHER): Payer: Medicare HMO | Admitting: Family Medicine

## 2021-03-04 ENCOUNTER — Encounter: Payer: Self-pay | Admitting: Family Medicine

## 2021-03-04 VITALS — BP 131/71 | HR 53 | Temp 97.3°F | Ht 70.0 in | Wt 147.0 lb

## 2021-03-04 DIAGNOSIS — E782 Mixed hyperlipidemia: Secondary | ICD-10-CM | POA: Diagnosis not present

## 2021-03-04 DIAGNOSIS — E119 Type 2 diabetes mellitus without complications: Secondary | ICD-10-CM

## 2021-03-04 DIAGNOSIS — E039 Hypothyroidism, unspecified: Secondary | ICD-10-CM

## 2021-03-04 LAB — BAYER DCA HB A1C WAIVED: HB A1C (BAYER DCA - WAIVED): 4.9 % (ref <7.0)

## 2021-03-04 MED ORDER — LEVOTHYROXINE SODIUM 88 MCG PO TABS: ORAL_TABLET | ORAL | 3 refills | Status: DC

## 2021-03-04 MED ORDER — METFORMIN HCL 500 MG PO TABS: ORAL_TABLET | ORAL | 3 refills | Status: DC

## 2021-03-04 MED ORDER — ATORVASTATIN CALCIUM 40 MG PO TABS: ORAL_TABLET | ORAL | 3 refills | Status: DC

## 2021-03-04 MED ORDER — LEVOTHYROXINE SODIUM 88 MCG PO TABS
ORAL_TABLET | ORAL | 3 refills | Status: DC
Start: 2021-03-04 — End: 2022-04-03

## 2021-03-04 NOTE — Progress Notes (Signed)
Subjective:  Patient ID: Arthur Franklin,  male    DOB: [birth year]  Age: 87 y.o.    CC: Medical Management of Chronic Issues   HPI Arthur Franklin presents for  follow-up of hypertension. Patient has no history of headache chest pain or shortness of breath or recent cough. Patient also denies symptoms of TIA such as numbness weakness lateralizing. Patient denies side effects from medication. States taking it regularly.  Patient also  in for follow-up of elevated cholesterol. Doing well without complaints on current medication. Denies side effects  including myalgia and arthralgia and nausea. Also in today for liver function testing. Currently no chest pain, shortness of breath or other cardiovascular related symptoms noted.  Follow-up of diabetes. Patient does not check blood sugar at home. Patient denies symptoms such as excessive hunger or urinary frequency, excessive hunger, nausea No significant hypoglycemic spells noted. Medications reviewed. Pt reports taking them regularly. Pt. denies complication/adverse reaction today.    History Arthur Franklin has a past medical history of Diabetes mellitus without complication ([place]), Hyperlipidemia, and Thyroid disease.   He has no past surgical history on file.   His family history is not on file.He reports that he has never smoked. He has never used smokeless tobacco. He reports that he does not drink alcohol and does not use drugs.  No current outpatient medications on file prior to visit.   No current facility-administered medications on file prior to visit.    ROS Review of Systems  Constitutional:  Positive for unexpected weight change (weight down 5 lbs.). Negative for fever.  Respiratory:  Negative for shortness of breath.   Cardiovascular:  Negative for chest pain.  Musculoskeletal:  Negative for arthralgias.  Skin:  Negative for rash.   Objective:  BP 131/71   Pulse (!) 53   Temp (!) 97.3 F (36.3 C)   Ht '5\' 10"'  (1.778 m)    Wt 147 lb (66.7 kg)   SpO2 98%   BMI 21.09 kg/m   BP Readings from Last 3 Encounters:  03/04/21 131/71  07/03/20 132/73  11/01/18 139/63    Wt Readings from Last 3 Encounters:  03/04/21 147 lb (66.7 kg)  07/03/20 152 lb 12.8 oz (69.3 kg)  11/01/18 155 lb 4 oz (70.4 kg)     Physical Exam Constitutional:      General: He is not in acute distress.    Appearance: He is well-developed.  HENT:     Head: Normocephalic and atraumatic.     Right Ear: External ear normal.     Left Ear: External ear normal.     Nose: Nose normal.  Eyes:     Conjunctiva/sclera: Conjunctivae normal.     Pupils: Pupils are equal, round, and reactive to light.  Cardiovascular:     Rate and Rhythm: Normal rate and regular rhythm.     Heart sounds: Normal heart sounds. No murmur heard. Pulmonary:     Effort: Pulmonary effort is normal. No respiratory distress.     Breath sounds: Normal breath sounds. No wheezing or rales.  Abdominal:     Palpations: Abdomen is soft.     Tenderness: There is no abdominal tenderness.  Musculoskeletal:        General: Normal range of motion.     Cervical back: Normal range of motion and neck supple.  Skin:    General: Skin is warm and dry.  Neurological:     Mental Status: He is alert and oriented to person, place,  and time.     Deep Tendon Reflexes: Reflexes are normal and symmetric.  Psychiatric:        Behavior: Behavior normal.        Thought Content: Thought content normal.        Judgment: Judgment normal.    Diabetic Foot Exam - Simple   No data filed       Assessment & Plan:   Arthur Franklin was seen today for medical management of chronic issues.  Diagnoses and all orders for this visit:  Type 2 diabetes mellitus without complication, without long-term current use of insulin (HCC) -     Bayer DCA Hb A1c Waived -     CMP14+EGFR -     CBC with Differential/Platelet -     metFORMIN (GLUCOPHAGE) 500 MG tablet; TAKE 1 TABLET BY MOUTH ONCE DAILY WITH  BREAKFAST  Hypothyroidism, unspecified type -     TSH + free T4 -     Discontinue: levothyroxine (EUTHYROX) 88 MCG tablet; TAKE 1 TABLET BY MOUTH ONCE DAILY BEFORE BREAKFAST -     levothyroxine (EUTHYROX) 88 MCG tablet; TAKE 1 TABLET BY MOUTH ONCE DAILY BEFORE BREAKFAST  Mixed hyperlipidemia -     Lipid panel -     atorvastatin (LIPITOR) 40 MG tablet; TAKE 1 TABLET BY MOUTH ONCE DAILY  I have discontinued [place] L. Rowand's Euthyrox. I have also changed his atorvastatin and metFORMIN. Additionally, I am having him maintain his levothyroxine.  Meds ordered this encounter  Medications   atorvastatin (LIPITOR) 40 MG tablet    Sig: TAKE 1 TABLET BY MOUTH ONCE DAILY    Dispense:  90 tablet    Refill:  3   DISCONTD: levothyroxine (EUTHYROX) 88 MCG tablet    Sig: TAKE 1 TABLET BY MOUTH ONCE DAILY BEFORE BREAKFAST    Dispense:  90 tablet    Refill:  3   metFORMIN (GLUCOPHAGE) 500 MG tablet    Sig: TAKE 1 TABLET BY MOUTH ONCE DAILY WITH BREAKFAST    Dispense:  90 tablet    Refill:  3   levothyroxine (EUTHYROX) 88 MCG tablet    Sig: TAKE 1 TABLET BY MOUTH ONCE DAILY BEFORE BREAKFAST    Dispense:  90 tablet    Refill:  3     Follow-up: Return in about 6 months (around 09/04/2021).  Claretta Fraise, M.D.

## 2021-03-05 LAB — TSH+FREE T4
Free T4: 1.54 ng/dL (ref 0.82–1.77)
TSH: 0.531 u[IU]/mL (ref 0.450–4.500)

## 2021-03-05 LAB — CBC WITH DIFFERENTIAL/PLATELET
Basophils Absolute: 0.1 10*3/uL (ref 0.0–0.2)
Basos: 1 %
EOS (ABSOLUTE): 0.3 10*3/uL (ref 0.0–0.4)
Eos: 6 %
Hematocrit: 40.9 % (ref 37.5–51.0)
Hemoglobin: 13.6 g/dL (ref 13.0–17.7)
Immature Grans (Abs): 0 10*3/uL (ref 0.0–0.1)
Immature Granulocytes: 0 %
Lymphocytes Absolute: 2.1 10*3/uL (ref 0.7–3.1)
Lymphs: 36 %
MCH: 31.4 pg (ref 26.6–33.0)
MCHC: 33.3 g/dL (ref 31.5–35.7)
MCV: 95 fL (ref 79–97)
Monocytes Absolute: 0.6 10*3/uL (ref 0.1–0.9)
Monocytes: 11 %
Neutrophils Absolute: 2.7 10*3/uL (ref 1.4–7.0)
Neutrophils: 46 %
Platelets: 202 10*3/uL (ref 150–450)
RBC: 4.33 x10E6/uL (ref 4.14–5.80)
RDW: 12.5 % (ref 11.6–15.4)
WBC: 5.8 10*3/uL (ref 3.4–10.8)

## 2021-03-05 LAB — CMP14+EGFR
ALT: 14 IU/L (ref 0–44)
AST: 18 IU/L (ref 0–40)
Albumin/Globulin Ratio: 1.6 (ref 1.2–2.2)
Albumin: 3.9 g/dL (ref 3.6–4.6)
Alkaline Phosphatase: 74 IU/L (ref 44–121)
BUN/Creatinine Ratio: 13 (ref 10–24)
BUN: 14 mg/dL (ref 8–27)
Bilirubin Total: 0.5 mg/dL (ref 0.0–1.2)
CO2: 23 mmol/L (ref 20–29)
Calcium: 9 mg/dL (ref 8.6–10.2)
Chloride: 105 mmol/L (ref 96–106)
Creatinine, Ser: 1.07 mg/dL (ref 0.76–1.27)
Globulin, Total: 2.5 g/dL (ref 1.5–4.5)
Glucose: 103 mg/dL — ABNORMAL HIGH (ref 65–99)
Potassium: 4.4 mmol/L (ref 3.5–5.2)
Sodium: 143 mmol/L (ref 134–144)
Total Protein: 6.4 g/dL (ref 6.0–8.5)
eGFR: 68 mL/min/{1.73_m2} (ref >59)

## 2021-03-05 LAB — LIPID PANEL
Chol/HDL Ratio: 3.7 ratio (ref 0.0–5.0)
Cholesterol, Total: 179 mg/dL (ref 100–199)
HDL: 48 mg/dL (ref >39)
LDL Chol Calc (NIH): 117 mg/dL — ABNORMAL HIGH (ref 0–99)
Triglycerides: 73 mg/dL (ref 0–149)
VLDL Cholesterol Cal: 14 mg/dL (ref 5–40)

## 2021-09-17 ENCOUNTER — Telehealth: Payer: Self-pay | Admitting: Family Medicine

## 2021-09-17 NOTE — Telephone Encounter (Signed)
No answer unable to leave a message for patient to call back and schedule Medicare Annual Wellness Visit (AWV) to be completed by video or phone.   Last AWV: 12/21/2015  Please schedule at anytime with [place]  45 minute appointment  Any questions, please contact me at [phone]

## 2021-10-07 ENCOUNTER — Telehealth: Payer: Self-pay | Admitting: Family Medicine

## 2021-10-07 NOTE — Telephone Encounter (Signed)
Left message for patient to call back and schedule Medicare Annual Wellness Visit (AWV) to be completed by video or phone.   Last AWV: 12/21/2015  Please schedule at anytime with [place]  45 minute appointment  Any questions, please contact me at [phone]

## 2021-10-17 ENCOUNTER — Ambulatory Visit (INDEPENDENT_AMBULATORY_CARE_PROVIDER_SITE_OTHER): Payer: Medicare HMO

## 2021-10-17 VITALS — Ht 69.0 in | Wt 154.0 lb

## 2021-10-17 DIAGNOSIS — Z0000 Encounter for general adult medical examination without abnormal findings: Secondary | ICD-10-CM

## 2021-10-17 NOTE — Patient Instructions (Signed)
Arthur Franklin , Thank you for taking time to come for your Medicare Wellness Visit. I appreciate your ongoing commitment to your health goals. Please review the following plan we discussed and let me know if I can assist you in the future.   Screening recommendations/referrals: Colonoscopy: no longer required Recommended yearly ophthalmology/optometry visit for glaucoma screening and checkup Recommended yearly dental visit for hygiene and checkup  Vaccinations: Influenza vaccine: Done 2022 - Repeat annually Pneumococcal vaccine: Done 10/23/2017 & 07/17/2020 Tdap vaccine: Due - every 10 years Shingles vaccine: Due - Shingrix 2 doses 2-6 months apart   Covid-19:  Done 10/03/2019, 10/31/2019, & 08/07/2020  Advanced directives: Please bring a copy of your health care power of attorney and living will to the office to be added to your chart at your convenience.   Conditions/risks identified: Aim for 30 minutes of exercise or brisk walking, 6-8 glasses of water, and 5 servings of fruits and vegetables each day.   Next appointment: Follow up in one year for your annual wellness visit.   Preventive Care 86 Years and Older, Male  Preventive care refers to lifestyle choices and visits with your health care provider that can promote health and wellness. What does preventive care include? A yearly physical exam. This is also called an annual well check. Dental exams once or twice a year. Routine eye exams. Ask your health care provider how often you should have your eyes checked. Personal lifestyle choices, including: Daily care of your teeth and gums. Regular physical activity. Eating a healthy diet. Avoiding tobacco and drug use. Limiting alcohol use. Practicing safe sex. Taking low doses of aspirin every day. Taking vitamin and mineral supplements as recommended by your health care provider. What happens during an annual well check? The services and screenings done by your health care provider  during your annual well check will depend on your age, overall health, lifestyle risk factors, and family history of disease. Counseling  Your health care provider may ask you questions about your: Alcohol use. Tobacco use. Drug use. Emotional well-being. Home and relationship well-being. Sexual activity. Eating habits. History of falls. Memory and ability to understand (cognition). Work and work Statistician. Screening  You may have the following tests or measurements: Height, weight, and BMI. Blood pressure. Lipid and cholesterol levels. These may be checked every 5 years, or more frequently if you are over 86 years old. Skin check. Lung cancer screening. You may have this screening every year starting at age 86 if you have a 30-pack-year history of smoking and currently smoke or have quit within the past 15 years. Fecal occult blood test (FOBT) of the stool. You may have this test every year starting at age 86. Flexible sigmoidoscopy or colonoscopy. You may have a sigmoidoscopy every 5 years or a colonoscopy every 10 years starting at age 86. Prostate cancer screening. Recommendations will vary depending on your family history and other risks. Hepatitis C blood test. Hepatitis B blood test. Sexually transmitted disease (STD) testing. Diabetes screening. This is done by checking your blood sugar (glucose) after you have not eaten for a while (fasting). You may have this done every 1-3 years. Abdominal aortic aneurysm (AAA) screening. You may need this if you are a current or former smoker. Osteoporosis. You may be screened starting at age 86 if you are at high risk. Talk with your health care provider about your test results, treatment options, and if necessary, the need for more tests. Vaccines  Your health care provider  may recommend certain vaccines, such as: Influenza vaccine. This is recommended every year. Tetanus, diphtheria, and acellular pertussis (Tdap, Td) vaccine. You  may need a Td booster every 10 years. Zoster vaccine. You may need this after age 7. Pneumococcal 13-valent conjugate (PCV13) vaccine. One dose is recommended after age 96. Pneumococcal polysaccharide (PPSV23) vaccine. One dose is recommended after age 75. Talk to your health care provider about which screenings and vaccines you need and how often you need them. This information is not intended to replace advice given to you by your health care provider. Make sure you discuss any questions you have with your health care provider. Document Released: 08/31/2015 Document Revised: 04/23/2016 Document Reviewed: 06/05/2015 Elsevier Interactive Patient Education  2017 [place] Prevention in the Home Falls can cause injuries. They can happen to people of all ages. There are many things you can do to make your home safe and to help prevent falls. What can I do on the outside of my home? Regularly fix the edges of walkways and driveways and fix any cracks. Remove anything that might make you trip as you walk through a door, such as a raised step or threshold. Trim any bushes or trees on the path to your home. Use bright outdoor lighting. Clear any walking paths of anything that might make someone trip, such as rocks or tools. Regularly check to see if handrails are loose or broken. Make sure that both sides of any steps have handrails. Any raised decks and porches should have guardrails on the edges. Have any leaves, snow, or ice cleared regularly. Use sand or salt on walking paths during winter. Clean up any spills in your garage right away. This includes oil or grease spills. What can I do in the bathroom? Use night lights. Install grab bars by the toilet and in the tub and shower. Do not use towel bars as grab bars. Use non-skid mats or decals in the tub or shower. If you need to sit down in the shower, use a plastic, non-slip stool. Keep the floor dry. Clean up any water that spills  on the floor as soon as it happens. Remove soap buildup in the tub or shower regularly. Attach bath mats securely with double-sided non-slip rug tape. Do not have throw rugs and other things on the floor that can make you trip. What can I do in the bedroom? Use night lights. Make sure that you have a light by your bed that is easy to reach. Do not use any sheets or blankets that are too big for your bed. They should not hang down onto the floor. Have a firm chair that has side arms. You can use this for support while you get dressed. Do not have throw rugs and other things on the floor that can make you trip. What can I do in the kitchen? Clean up any spills right away. Avoid walking on wet floors. Keep items that you use a lot in easy-to-reach places. If you need to reach something above you, use a strong step stool that has a grab bar. Keep electrical cords out of the way. Do not use floor polish or wax that makes floors slippery. If you must use wax, use non-skid floor wax. Do not have throw rugs and other things on the floor that can make you trip. What can I do with my stairs? Do not leave any items on the stairs. Make sure that there are handrails on both sides  of the stairs and use them. Fix handrails that are broken or loose. Make sure that handrails are as long as the stairways. Check any carpeting to make sure that it is firmly attached to the stairs. Fix any carpet that is loose or worn. Avoid having throw rugs at the top or bottom of the stairs. If you do have throw rugs, attach them to the floor with carpet tape. Make sure that you have a light switch at the top of the stairs and the bottom of the stairs. If you do not have them, ask someone to add them for you. What else can I do to help prevent falls? Wear shoes that: Do not have high heels. Have rubber bottoms. Are comfortable and fit you well. Are closed at the toe. Do not wear sandals. If you use a stepladder: Make  sure that it is fully opened. Do not climb a closed stepladder. Make sure that both sides of the stepladder are locked into place. Ask someone to hold it for you, if possible. Clearly mark and make sure that you can see: Any grab bars or handrails. First and last steps. Where the edge of each step is. Use tools that help you move around (mobility aids) if they are needed. These include: Canes. Walkers. Scooters. Crutches. Turn on the lights when you go into a dark area. Replace any light bulbs as soon as they burn out. Set up your furniture so you have a clear path. Avoid moving your furniture around. If any of your floors are uneven, fix them. If there are any pets around you, be aware of where they are. Review your medicines with your doctor. Some medicines can make you feel dizzy. This can increase your chance of falling. Ask your doctor what other things that you can do to help prevent falls. This information is not intended to replace advice given to you by your health care provider. Make sure you discuss any questions you have with your health care provider. Document Released: 05/31/2009 Document Revised: 01/10/2016 Document Reviewed: 09/08/2014 Elsevier Interactive Patient Education  2017 [employer].

## 2021-10-17 NOTE — Progress Notes (Signed)
Subjective:   Khayri Kargbo is a 88 y.o. male who presents for Medicare Annual/Subsequent preventive examination.  Virtual Visit via Telephone Note  I connected with  Celene Squibb on 10/17/21 at  9:00 AM EST by telephone and verified that I am speaking with the correct person using two identifiers.  Location: Patient: Home Provider: [facility] Persons participating in the virtual visit: patient/Nurse Health Advisor   I discussed the limitations, risks, security and privacy concerns of performing an evaluation and management service by telephone and the availability of in person appointments. The patient expressed understanding and agreed to proceed.  Interactive audio and video telecommunications were attempted between this nurse and patient, however failed, due to patient having technical difficulties OR patient did not have access to video capability.  We continued and completed visit with audio only.  Some vital signs may be absent or patient reported.   Keva Darty E Caroll Cunnington, LPN   Review of Systems     Cardiac Risk Factors include: advanced age (>82men, >56 women);dyslipidemia;hypertension;male gender;sedentary lifestyle     Objective:    Today's Vitals   10/17/21 0902  Weight: 154 lb (69.9 kg)  Height: 5\' 9"  (1.753 m)   Body mass index is 22.74 kg/m.  Advanced Directives 10/17/2021  Does Patient Have a Medical Advance Directive? Yes  Type of Paramedic of [place];Living will  Copy of [place] in Chart? No - copy requested    Current Medications (verified) Outpatient Encounter Medications as of 10/17/2021  Medication Sig   atorvastatin (LIPITOR) 40 MG tablet TAKE 1 TABLET BY MOUTH ONCE DAILY   levothyroxine (EUTHYROX) 88 MCG tablet TAKE 1 TABLET BY MOUTH ONCE DAILY BEFORE BREAKFAST   metFORMIN (GLUCOPHAGE) 500 MG tablet TAKE 1 TABLET BY MOUTH ONCE DAILY WITH BREAKFAST   No facility-administered encounter medications on file as  of 10/17/2021.    Allergies (verified) Patient has no known allergies.   History: Past Medical History:  Diagnosis Date   Diabetes mellitus without complication ([place])    Hyperlipidemia    Thyroid disease    History reviewed. No pertinent surgical history. History reviewed. No pertinent family history. Social History   Socioeconomic History   Marital status: Married    Spouse name: Wilburn Cornelia   Number of children: 1   Years of education: Not on file   Highest education level: Not on file  Occupational History   Occupation: retired  Tobacco Use   Smoking status: Never   Smokeless tobacco: Never  Substance and Sexual Activity   Alcohol use: No   Drug use: No   Sexual activity: Not on file  Other Topics Concern   Not on file  Social History Narrative   One daughter, one grandchild - they live nearby   Social Determinants of Health   Financial Resource Strain: Low Risk    Difficulty of Paying Living Expenses: Not hard at all  Food Insecurity: No Food Insecurity   Worried About Charity fundraiser in the Last Year: Never true   Arboriculturist in the Last Year: Never true  Transportation Needs: No Transportation Needs   Lack of Transportation (Medical): No   Lack of Transportation (Non-Medical): No  Physical Activity: Insufficiently Active   Days of Exercise per Week: 7 days   Minutes of Exercise per Session: 20 min  Stress: No Stress Concern Present   Feeling of Stress : Not at all  Social Connections: Socially Integrated   Frequency  of Communication with Friends and Family: More than three times a week   Frequency of Social Gatherings with Friends and Family: More than three times a week   Attends Religious Services: More than 4 times per year   Active Member of [employer] or Organizations: Yes   Attends Music therapist: More than 4 times per year   Marital Status: Married    Tobacco Counseling Counseling given: Not Answered   Clinical  Intake:  Pre-visit preparation completed: Yes  Pain : No/denies pain     BMI - recorded: 22.74 Nutritional Status: BMI of 19-24  Normal Nutritional Risks: None Diabetes: No  How often do you need to have someone help you when you read instructions, pamphlets, or other written materials from your doctor or pharmacy?: 1 - Never  Diabetic? no  Interpreter Needed?: No  Information entered by :: Jamariyah Johannsen, LPN   Activities of Daily Living In your present state of health, do you have any difficulty performing the following activities: 10/17/2021  Hearing? N  Vision? N  Difficulty concentrating or making decisions? N  Walking or climbing stairs? N  Dressing or bathing? N  Doing errands, shopping? N  Preparing Food and eating ? N  Using the Toilet? N  In the past six months, have you accidently leaked urine? N  Do you have problems with loss of bowel control? N  Managing your Medications? N  Managing your Finances? N  Housekeeping or managing your Housekeeping? N  Some recent data might be hidden    Patient Care Team: Claretta Fraise, MD as PCP - General (Family Medicine)  Indicate any recent Medical Services you may have received from other than [facility] providers in the past year (date may be approximate).     Assessment:   This is a routine wellness examination for [place].  Hearing/Vision screen Hearing Screening - Comments:: Denies hearing difficulties - chronic tinnitis only Vision Screening - Comments:: No vision concerns- up to date with routine eye exams with optometrist in [place]  Dietary issues and exercise activities discussed: Current Exercise Habits: Home exercise routine, Type of exercise: walking;Other - see comments (chopping wood, yard work), Time (Minutes): 20, Frequency (Times/Week): 7, Weekly Exercise (Minutes/Week): 140, Intensity: Moderate, Exercise limited by: None identified   Goals Addressed             This Visit's Progress    Exercise  150 min/wk Moderate Activity   Not on track      Depression Screen PHQ 2/9 Scores 10/17/2021 03/04/2021 07/03/2020 11/01/2018 05/03/2018 12/22/2016 09/19/2016  PHQ - 2 Score 0 0 0 0 0 0 0    Fall Risk Fall Risk  10/17/2021 03/04/2021 07/03/2020 11/01/2018 05/03/2018  Falls in the past year? 0 0 0 0 No  Number falls in past yr: 0 - 0 - -  Injury with Fall? 0 - 0 - -  Risk for fall due to : No Fall Risks - No Fall Risks - -  Follow up Falls prevention discussed - Falls evaluation completed - -    FALL RISK PREVENTION PERTAINING TO THE HOME:  Any stairs in or around the home? Yes  If so, are there any without handrails? No  Home free of loose throw rugs in walkways, pet beds, electrical cords, etc? Yes  Adequate lighting in your home to reduce risk of falls? Yes   ASSISTIVE DEVICES UTILIZED TO PREVENT FALLS:  Life alert? No  Use of a cane, walker or w/c? No  Grab bars in the bathroom? Yes  Shower chair or bench in shower? Yes  Elevated toilet seat or a handicapped toilet? Yes   TIMED UP AND GO:  Was the test performed? No . Telephonic visit  Cognitive Function:     6CIT Screen 10/17/2021  What Year? 0 points  What month? 0 points  What time? 0 points  Count back from 20 0 points  Months in reverse 0 points  Repeat phrase 4 points  Total Score 4    Immunizations Immunization History  Administered Date(s) Administered   Influenza Split 06/17/2013   Influenza, High Dose Seasonal PF 06/22/2015, 07/07/2018   Influenza,inj,Quad PF,6+ Mos 06/06/2014, 05/20/2016   Influenza-Unspecified 07/03/2017, 06/14/2019, 07/17/2020   Moderna Sars-Covid-2 Vaccination 10/03/2019, 10/31/2019, 08/07/2020   Pneumococcal Conjugate-13 10/23/2017   Pneumococcal Polysaccharide-23 07/17/2020    TDAP status: Due, Education has been provided regarding the importance of this vaccine. Advised may receive this vaccine at local pharmacy or Health Dept. Aware to provide a copy of the vaccination record if  obtained from local pharmacy or Health Dept. Verbalized acceptance and understanding.  Flu Vaccine status: Up to date  Pneumococcal vaccine status: Up to date  Covid-19 vaccine status: Completed vaccines  Qualifies for Shingles Vaccine? Yes   Zostavax completed No   Shingrix Completed?: No.    Education has been provided regarding the importance of this vaccine. Patient has been advised to call insurance company to determine out of pocket expense if they have not yet received this vaccine. Advised may also receive vaccine at local pharmacy or Health Dept. Verbalized acceptance and understanding.  Screening Tests Health Maintenance  Topic Date Due   OPHTHALMOLOGY EXAM  Never done   TETANUS/TDAP  Never done   Zoster Vaccines- Shingrix (1 of 2) Never done   FOOT EXAM  01/27/2019   URINE MICROALBUMIN  01/27/2019   COVID-19 Vaccine (4 - Booster for Moderna series) 10/02/2020   INFLUENZA VACCINE  03/18/2021   HEMOGLOBIN A1C  09/04/2021   Pneumonia Vaccine 59+ Years old  Completed   HPV VACCINES  Aged Out    Health Maintenance  Health Maintenance Due  Topic Date Due   OPHTHALMOLOGY EXAM  Never done   TETANUS/TDAP  Never done   Zoster Vaccines- Shingrix (1 of 2) Never done   FOOT EXAM  01/27/2019   URINE MICROALBUMIN  01/27/2019   COVID-19 Vaccine (4 - Booster for Moderna series) 10/02/2020   INFLUENZA VACCINE  03/18/2021   HEMOGLOBIN A1C  09/04/2021    Colorectal cancer screening: No longer required.   Lung Cancer Screening: (Low Dose CT Chest recommended if Age 59-80 years, 30 pack-year currently smoking OR have quit w/in 15years.) does not qualify.  Additional Screening:  Hepatitis C Screening: does not qualify  Vision Screening: Recommended annual ophthalmology exams for early detection of glaucoma and other disorders of the eye. Is the patient up to date with their annual eye exam?  Yes  Who is the provider or what is the name of the office in which the patient attends  annual eye exams? Optometrist in [place] If pt is not established with a provider, would they like to be referred to a provider to establish care? No .   Dental Screening: Recommended annual dental exams for proper oral hygiene  Community Resource Referral / Chronic Care Management: CRR required this visit?  No   CCM required this visit?  No      Plan:     I have personally reviewed  and noted the following in the patients chart:   Medical and social history Use of alcohol, tobacco or illicit drugs  Current medications and supplements including opioid prescriptions. Patient is not currently taking opioid prescriptions. Functional ability and status Nutritional status Physical activity Advanced directives List of other physicians Hospitalizations, surgeries, and ER visits in previous 12 months Vitals Screenings to include cognitive, depression, and falls Referrals and appointments  In addition, I have reviewed and discussed with patient certain preventive protocols, quality metrics, and best practice recommendations. A written personalized care plan for preventive services as well as general preventive health recommendations were provided to patient.     Sandrea Hammond, LPN   9/0/9311   Nurse Notes: None

## 2021-11-12 ENCOUNTER — Encounter: Payer: Self-pay | Admitting: Family Medicine

## 2021-11-12 ENCOUNTER — Ambulatory Visit (INDEPENDENT_AMBULATORY_CARE_PROVIDER_SITE_OTHER): Payer: Medicare HMO | Admitting: Family Medicine

## 2021-11-12 VITALS — BP 131/66 | HR 63 | Temp 97.1°F | Ht 69.0 in | Wt 139.0 lb

## 2021-11-12 DIAGNOSIS — E039 Hypothyroidism, unspecified: Secondary | ICD-10-CM

## 2021-11-12 DIAGNOSIS — E119 Type 2 diabetes mellitus without complications: Secondary | ICD-10-CM

## 2021-11-12 DIAGNOSIS — E782 Mixed hyperlipidemia: Secondary | ICD-10-CM

## 2021-11-12 LAB — BAYER DCA HB A1C WAIVED: HB A1C (BAYER DCA - WAIVED): 5.6 % (ref 4.8–5.6)

## 2021-11-12 NOTE — Progress Notes (Signed)
? ?Subjective:  ?Patient ID: Arthur Franklin,  ?male    DOB: 01/04/1932  Age: 88 y.o.  ? ? ?CC: Medical Management of Chronic Issues ? ? ?HPI ?Arthur Franklin presents for  follow-up of hypertension. Patient has no history of headache chest pain or shortness of breath or recent cough. Patient also denies symptoms of TIA such as numbness weakness lateralizing. Patient denies side effects from medication. States taking it regularly. ?Patient also  in for follow-up of elevated cholesterol. Doing well without complaints on current medication. Denies side effects  including myalgia and arthralgia and nausea. Also in today for liver function testing. Currently no chest pain, shortness of breath or other cardiovascular related symptoms noted. ? ? follow-up on  thyroid. The patient has a history of hypothyroidism for many years. It has been stable recently. Pt. denies any change in  voice, loss of hair, heat or cold intolerance. Energy level has been adequate to good. Patient denies constipation and diarrhea. No myxedema. Medication is as noted below. Verified that pt is taking it daily on an empty stomach. Well tolerated. ? ? ?Follow-up of diabetes. Patient does not check blood sugar at home. Patient denies symptoms such as excessive hunger or urinary frequency, excessive hunger, nausea ?No significant hypoglycemic spells noted. ?Medications reviewed. Pt reports taking them regularly. Pt. denies complication/adverse reaction today.  ? ? ?History ?Arthur Franklin has a past medical history of Diabetes mellitus without complication (HCC), Hyperlipidemia, and Thyroid disease.  ? ?He has no past surgical history on file.  ? ?His family history is not on file.He reports that he has never smoked. He has never used smokeless tobacco. He reports that he does not drink alcohol and does not use drugs. ? ?Current Outpatient Medications on File Prior to Visit  ?Medication Sig Dispense Refill  ? atorvastatin (LIPITOR) 40 MG tablet TAKE 1 TABLET  BY MOUTH ONCE DAILY 90 tablet 3  ? levothyroxine (EUTHYROX) 88 MCG tablet TAKE 1 TABLET BY MOUTH ONCE DAILY BEFORE BREAKFAST 90 tablet 3  ? metFORMIN (GLUCOPHAGE) 500 MG tablet TAKE 1 TABLET BY MOUTH ONCE DAILY WITH BREAKFAST 90 tablet 3  ? ?No current facility-administered medications on file prior to visit.  ? ? ?ROS ?Review of Systems  ?Constitutional:  Positive for appetite change (decreased). Negative for fatigue and fever.  ?Respiratory:  Negative for shortness of breath.   ?Cardiovascular:  Negative for chest pain.  ?Musculoskeletal:  Negative for arthralgias.  ?Skin:  Negative for rash.  ?Neurological:  Negative for weakness.  ?Objective:  ?BP 131/66   Pulse 63   Temp (!) 97.1 ?F (36.2 ?C)   Ht 5' 9" (1.753 m)   Wt 139 lb (63 kg)   SpO2 97%   BMI 20.53 kg/m?  ? ?BP Readings from Last 3 Encounters:  ?11/12/21 131/66  ?03/04/21 131/71  ?07/03/20 132/73  ? ? ?Wt Readings from Last 3 Encounters:  ?11/12/21 139 lb (63 kg)  ?10/17/21 154 lb (69.9 kg)  ?03/04/21 147 lb (66.7 kg)  ? ? ? ?Physical Exam ?Constitutional:   ?   General: He is not in acute distress. ?   Appearance: He is well-developed.  ?HENT:  ?   Head: Normocephalic and atraumatic.  ?   Right Ear: External ear normal.  ?   Left Ear: External ear normal.  ?   Nose: Nose normal.  ?Eyes:  ?   Conjunctiva/sclera: Conjunctivae normal.  ?   Pupils: Pupils are equal, round, and reactive to light.  ?Cardiovascular:  ?     Rate and Rhythm: Normal rate and regular rhythm.  ?   Heart sounds: Normal heart sounds. No murmur heard. ?Pulmonary:  ?   Effort: Pulmonary effort is normal. No respiratory distress.  ?   Breath sounds: Normal breath sounds. No wheezing or rales.  ?Abdominal:  ?   Palpations: Abdomen is soft.  ?   Tenderness: There is no abdominal tenderness.  ?Musculoskeletal:     ?   General: Normal range of motion.  ?   Cervical back: Normal range of motion and neck supple.  ?Skin: ?   General: Skin is warm and dry.  ?Neurological:  ?   Mental  Status: He is alert and oriented to person, place, and time.  ?   Deep Tendon Reflexes: Reflexes are normal and symmetric.  ?Psychiatric:     ?   Behavior: Behavior normal.     ?   Thought Content: Thought content normal.     ?   Judgment: Judgment normal.  ? ? ?Diabetic Foot Exam - Simple   ?Simple Foot Form ?Diabetic Foot exam was performed with the following findings: Yes 11/12/2021  4:44 PM  ?Visual Inspection ?No deformities, no ulcerations, no other skin breakdown bilaterally: Yes ?Sensation Testing ?Intact to touch and monofilament testing bilaterally: Yes ?Pulse Check ?Posterior Tibialis and Dorsalis pulse intact bilaterally: Yes ?Comments ?  ? ? ? ? ?Assessment & Plan:  ? ?Josia was seen today for medical management of chronic issues. ? ?Diagnoses and all orders for this visit: ? ?Type 2 diabetes mellitus without complication, without long-term current use of insulin (HCC) ?-     Bayer DCA Hb A1c Waived ?-     CBC with Differential/Platelet ?-     CMP14+EGFR ?-     Microalbumin / creatinine urine ratio ? ?Hypothyroidism, unspecified type ?-     TSH + free T4 ? ?Mixed hyperlipidemia ?-     Lipid panel ? ? ?I am having Jyair L. Brayfield maintain his atorvastatin, metFORMIN, and levothyroxine. ? ?No orders of the defined types were placed in this encounter. ? ?Drink 2 cans of boost to help wit weight ? ? ?Follow-up: Return in about 6 months (around 05/15/2022) for cholesterol, Hypothyroidism, diabetes. ? ?Warren Stacks, M.D. ?

## 2021-11-13 LAB — CBC WITH DIFFERENTIAL/PLATELET
Basophils Absolute: 0.1 10*3/uL (ref 0.0–0.2)
Basos: 1 %
EOS (ABSOLUTE): 0.5 10*3/uL — ABNORMAL HIGH (ref 0.0–0.4)
Eos: 6 %
Hematocrit: 40.4 % (ref 37.5–51.0)
Hemoglobin: 13.8 g/dL (ref 13.0–17.7)
Immature Grans (Abs): 0 10*3/uL (ref 0.0–0.1)
Immature Granulocytes: 0 %
Lymphocytes Absolute: 3 10*3/uL (ref 0.7–3.1)
Lymphs: 42 %
MCH: 32.9 pg (ref 26.6–33.0)
MCHC: 34.2 g/dL (ref 31.5–35.7)
MCV: 96 fL (ref 79–97)
Monocytes Absolute: 0.6 10*3/uL (ref 0.1–0.9)
Monocytes: 8 %
Neutrophils Absolute: 3.1 10*3/uL (ref 1.4–7.0)
Neutrophils: 43 %
Platelets: 192 10*3/uL (ref 150–450)
RBC: 4.2 x10E6/uL (ref 4.14–5.80)
RDW: 12.4 % (ref 11.6–15.4)
WBC: 7.2 10*3/uL (ref 3.4–10.8)

## 2021-11-13 LAB — CMP14+EGFR
ALT: 17 IU/L (ref 0–44)
AST: 25 IU/L (ref 0–40)
Albumin/Globulin Ratio: 2 (ref 1.2–2.2)
Albumin: 4.4 g/dL (ref 3.6–4.6)
Alkaline Phosphatase: 76 IU/L (ref 44–121)
BUN/Creatinine Ratio: 18 (ref 10–24)
BUN: 16 mg/dL (ref 8–27)
Bilirubin Total: 0.4 mg/dL (ref 0.0–1.2)
CO2: 24 mmol/L (ref 20–29)
Calcium: 9.5 mg/dL (ref 8.6–10.2)
Chloride: 101 mmol/L (ref 96–106)
Creatinine, Ser: 0.9 mg/dL (ref 0.76–1.27)
Globulin, Total: 2.2 g/dL (ref 1.5–4.5)
Glucose: 138 mg/dL — ABNORMAL HIGH (ref 70–99)
Potassium: 4.4 mmol/L (ref 3.5–5.2)
Sodium: 140 mmol/L (ref 134–144)
Total Protein: 6.6 g/dL (ref 6.0–8.5)
eGFR: 83 mL/min/{1.73_m2} (ref >59)

## 2021-11-13 LAB — LIPID PANEL
Chol/HDL Ratio: 2.5 ratio (ref 0.0–5.0)
Cholesterol, Total: 118 mg/dL (ref 100–199)
HDL: 47 mg/dL (ref >39)
LDL Chol Calc (NIH): 54 mg/dL (ref 0–99)
Triglycerides: 84 mg/dL (ref 0–149)
VLDL Cholesterol Cal: 17 mg/dL (ref 5–40)

## 2021-11-13 LAB — TSH+FREE T4
Free T4: 1.61 ng/dL (ref 0.82–1.77)
TSH: 0.997 u[IU]/mL (ref 0.450–4.500)

## 2021-11-13 NOTE — Progress Notes (Signed)
Hello [name],  Your lab result is normal and/or stable.Some minor variations that are not significant are commonly marked abnormal, but do not represent any medical problem for you.  Best regards, Edgel Degnan, M.D.

## 2022-02-10 ENCOUNTER — Encounter: Payer: Self-pay | Admitting: Family Medicine

## 2022-02-10 ENCOUNTER — Ambulatory Visit (INDEPENDENT_AMBULATORY_CARE_PROVIDER_SITE_OTHER): Payer: Medicare HMO | Admitting: Family Medicine

## 2022-02-10 VITALS — BP 140/73 | HR 65 | Temp 97.9°F | Ht 69.0 in | Wt 138.2 lb

## 2022-02-10 DIAGNOSIS — M542 Cervicalgia: Secondary | ICD-10-CM | POA: Diagnosis not present

## 2022-02-10 MED ORDER — PREDNISONE 20 MG PO TABS: 20.0000 mg | ORAL_TABLET | Freq: Every day | ORAL | 0 refills | Status: AC

## 2022-02-10 NOTE — Progress Notes (Signed)
   Acute Office Visit  Subjective:     Patient ID: Arthur Franklin, male    DOB: 1932-01-22, 88 y.o.   MRN: [id number]  Chief Complaint  Patient presents with   Neck Pain    HPI Patient is in today for neck pain x 3 days. The pain is mainly on the right side of his neck. It is a constant pain. The pain is worse with movement. He has tired tylenol without relief. He has taken advil a few times and this did help. Denies numbness, tingling, injury, changes in vision, or focal weakness. Denies fever.   ROS As per HPI.      Objective:    BP 140/73   Pulse 65   Temp 97.9 F (36.6 C) (Temporal)   Ht '5\' 9"'$  (1.753 m)   Wt 138 lb 4 oz (62.7 kg)   SpO2 97%   BMI 20.42 kg/m    Physical Exam Vitals and nursing note reviewed.  Constitutional:      General: He is not in acute distress.    Appearance: He is not ill-appearing, toxic-appearing or diaphoretic.  Cardiovascular:     Rate and Rhythm: Normal rate and regular rhythm.     Heart sounds: Normal heart sounds. No murmur heard. Pulmonary:     Effort: Pulmonary effort is normal. No respiratory distress.     Breath sounds: Normal breath sounds.  Abdominal:     General: Bowel sounds are normal. There is no distension.     Palpations: Abdomen is soft.     Tenderness: There is no abdominal tenderness. There is no guarding or rebound.  Musculoskeletal:     Cervical back: No edema, erythema, rigidity or crepitus. Pain with movement (with rotation) and muscular tenderness present. No spinous process tenderness. Normal range of motion.  Lymphadenopathy:     Cervical: No cervical adenopathy.  Skin:    General: Skin is warm and dry.  Neurological:     General: No focal deficit present.     Mental Status: He is alert and oriented to person, place, and time.  Psychiatric:        Behavior: Behavior normal.     No results found for any visits on 02/10/22.      Assessment & Plan:   [name] was seen today for neck pain.  Diagnoses  and all orders for this visit:  Acute neck pain Discussed likely strain. Prednisone burst as below. Tylenol, heat, gentle stretching prn.  -     predniSONE (DELTASONE) 20 MG tablet; Take 1 tablet (20 mg total) by mouth daily with breakfast for 3 days.   Return if symptoms worsen or fail to improve.  The patient indicates understanding of these issues and agrees with the plan.  Gwenlyn Perking, FNP

## 2022-03-01 ENCOUNTER — Other Ambulatory Visit: Payer: Self-pay | Admitting: Family Medicine

## 2022-03-01 DIAGNOSIS — E782 Mixed hyperlipidemia: Secondary | ICD-10-CM

## 2022-03-01 DIAGNOSIS — E119 Type 2 diabetes mellitus without complications: Secondary | ICD-10-CM

## 2022-04-03 ENCOUNTER — Other Ambulatory Visit: Payer: Self-pay | Admitting: Family Medicine

## 2022-04-03 DIAGNOSIS — E039 Hypothyroidism, unspecified: Secondary | ICD-10-CM

## 2022-04-03 NOTE — Telephone Encounter (Signed)
Refill sent to pharmacy. Patient is due for follow up around  September 28th.

## 2022-06-02 ENCOUNTER — Encounter: Payer: Self-pay | Admitting: Family Medicine

## 2022-06-02 ENCOUNTER — Ambulatory Visit (INDEPENDENT_AMBULATORY_CARE_PROVIDER_SITE_OTHER): Payer: Medicare HMO | Admitting: Family Medicine

## 2022-06-02 VITALS — BP 126/62 | HR 49 | Temp 97.2°F | Ht 69.0 in | Wt 139.4 lb

## 2022-06-02 DIAGNOSIS — E782 Mixed hyperlipidemia: Secondary | ICD-10-CM | POA: Diagnosis not present

## 2022-06-02 DIAGNOSIS — E119 Type 2 diabetes mellitus without complications: Secondary | ICD-10-CM

## 2022-06-02 DIAGNOSIS — E039 Hypothyroidism, unspecified: Secondary | ICD-10-CM | POA: Diagnosis not present

## 2022-06-02 LAB — BAYER DCA HB A1C WAIVED: HB A1C (BAYER DCA - WAIVED): 6.1 % — ABNORMAL HIGH (ref 4.8–5.6)

## 2022-06-02 MED ORDER — LEVOTHYROXINE SODIUM 88 MCG PO TABS: 88.0000 ug | ORAL_TABLET | Freq: Every day | ORAL | 3 refills | Status: DC

## 2022-06-02 MED ORDER — METFORMIN HCL 500 MG PO TABS: 500.0000 mg | ORAL_TABLET | Freq: Every day | ORAL | 3 refills | Status: DC

## 2022-06-02 MED ORDER — ATORVASTATIN CALCIUM 40 MG PO TABS: 40.0000 mg | ORAL_TABLET | Freq: Every day | ORAL | 3 refills | Status: DC

## 2022-06-02 NOTE — Progress Notes (Addendum)
Subjective:  Patient ID: Arthur Franklin, male    DOB: 03-Dec-1931  Age: 88 y.o. MRN: [id number]  CC: Medical Management of Chronic Issues   HPI Arthur Franklin presents for  follow-up on  thyroid. The patient has a history of hypothyroidism for many years. It has been stable recently. Pt. denies any change in  voice, loss of hair, heat or cold intolerance. Energy level has been adequate to good. Patient denies constipation and diarrhea. No myxedema. Medication is as noted below. Verified that pt is taking it daily on an empty stomach. Well tolerated.       06/02/2022    2:23 PM 02/10/2022    2:41 PM 11/12/2021    4:19 PM  Depression screen PHQ 2/9  Decreased Interest 0 0 0  Down, Depressed, Hopeless 0 0 0  PHQ - 2 Score 0 0 0  Altered sleeping  0   Tired, decreased energy  0   Change in appetite  0   Feeling bad or failure about yourself   0   Trouble concentrating  0   Moving slowly or fidgety/restless  0   Suicidal thoughts  0   PHQ-9 Score  0   Difficult doing work/chores  Not difficult at all     History Arthur Franklin has a past medical history of Diabetes mellitus without complication ([place]), Hyperlipidemia, and Thyroid disease.   He has no past surgical history on file.   His family history is not on file.He reports that he has never smoked. He has never used smokeless tobacco. He reports that he does not drink alcohol and does not use drugs.    ROS Review of Systems  Constitutional:  Negative for fever.  Respiratory:  Negative for shortness of breath.   Cardiovascular:  Negative for chest pain.  Musculoskeletal:  Negative for arthralgias.  Skin:  Negative for rash.    Objective:  BP 126/62   Pulse (!) 49   Temp (!) 97.2 F (36.2 C)   Ht '5\' 9"'  (1.753 m)   Wt 139 lb 6.4 oz (63.2 kg)   SpO2 98%   BMI 20.59 kg/m   BP Readings from Last 3 Encounters:  06/02/22 126/62  02/10/22 140/73  11/12/21 131/66    Wt Readings from Last 3 Encounters:  06/02/22 139 lb  6.4 oz (63.2 kg)  02/10/22 138 lb 4 oz (62.7 kg)  11/12/21 139 lb (63 kg)     Physical Exam Vitals reviewed.  Constitutional:      Appearance: He is well-developed.  HENT:     Head: Normocephalic and atraumatic.     Right Ear: External ear normal.     Left Ear: External ear normal.     Mouth/Throat:     Pharynx: No oropharyngeal exudate or posterior oropharyngeal erythema.  Eyes:     Pupils: Pupils are equal, round, and reactive to light.  Cardiovascular:     Rate and Rhythm: Normal rate and regular rhythm.     Heart sounds: No murmur heard. Pulmonary:     Effort: No respiratory distress.     Breath sounds: Normal breath sounds.  Musculoskeletal:     Cervical back: Normal range of motion and neck supple.  Neurological:     Mental Status: He is alert and oriented to person, place, and time.       Assessment & Plan:   Arthur Franklin was seen today for medical management of chronic issues.  Diagnoses and all orders for this visit:  Type 2 diabetes mellitus without complication, without long-term current use of insulin (HCC) -     Bayer DCA Hb A1c Waived -     CBC with Differential/Platelet -     CMP14+EGFR -     metFORMIN (GLUCOPHAGE) 500 MG tablet; Take 1 tablet (500 mg total) by mouth daily with breakfast.  Hypothyroidism, unspecified type -     TSH + free T4 -     levothyroxine (SYNTHROID) 88 MCG tablet; Take 1 tablet (88 mcg total) by mouth daily before breakfast.  Mixed hyperlipidemia -     Lipid panel -     atorvastatin (LIPITOR) 40 MG tablet; Take 1 tablet (40 mg total) by mouth daily.       I have changed Arthur Franklin's levothyroxine, metFORMIN, and atorvastatin. I am also having him maintain his ibuprofen.  Allergies as of 06/02/2022   No Known Allergies      Medication List        Accurate as of June 02, 2022  3:17 PM. If you have any questions, ask your nurse or doctor.          atorvastatin 40 MG tablet Commonly known as: LIPITOR Take  1 tablet (40 mg total) by mouth daily.   ibuprofen 400 MG tablet Commonly known as: ADVIL Take 400 mg by mouth every 6 (six) hours as needed.   levothyroxine 88 MCG tablet Commonly known as: SYNTHROID Take 1 tablet (88 mcg total) by mouth daily before breakfast.   metFORMIN 500 MG tablet Commonly known as: GLUCOPHAGE Take 1 tablet (500 mg total) by mouth daily with breakfast.         Follow-up: Return in about 6 months (around 12/02/2022).  Claretta Fraise, M.D.

## 2022-06-03 LAB — CBC WITH DIFFERENTIAL/PLATELET
Basophils Absolute: 0.1 10*3/uL (ref 0.0–0.2)
Basos: 1 %
EOS (ABSOLUTE): 0.2 10*3/uL (ref 0.0–0.4)
Eos: 3 %
Hematocrit: 40.3 % (ref 37.5–51.0)
Hemoglobin: 13.3 g/dL (ref 13.0–17.7)
Immature Grans (Abs): 0 10*3/uL (ref 0.0–0.1)
Immature Granulocytes: 0 %
Lymphocytes Absolute: 3 10*3/uL (ref 0.7–3.1)
Lymphs: 48 %
MCH: 31.1 pg (ref 26.6–33.0)
MCHC: 33 g/dL (ref 31.5–35.7)
MCV: 94 fL (ref 79–97)
Monocytes Absolute: 0.7 10*3/uL (ref 0.1–0.9)
Monocytes: 11 %
Neutrophils Absolute: 2.3 10*3/uL (ref 1.4–7.0)
Neutrophils: 37 %
Platelets: 216 10*3/uL (ref 150–450)
RBC: 4.28 x10E6/uL (ref 4.14–5.80)
RDW: 12.8 % (ref 11.6–15.4)
WBC: 6.2 10*3/uL (ref 3.4–10.8)

## 2022-06-03 LAB — CMP14+EGFR
ALT: 20 IU/L (ref 0–44)
AST: 22 IU/L (ref 0–40)
Albumin/Globulin Ratio: 1.6 (ref 1.2–2.2)
Albumin: 4.1 g/dL (ref 3.7–4.7)
Alkaline Phosphatase: 87 IU/L (ref 44–121)
BUN/Creatinine Ratio: 18 (ref 10–24)
BUN: 17 mg/dL (ref 8–27)
Bilirubin Total: 0.4 mg/dL (ref 0.0–1.2)
CO2: 27 mmol/L (ref 20–29)
Calcium: 9.2 mg/dL (ref 8.6–10.2)
Chloride: 104 mmol/L (ref 96–106)
Creatinine, Ser: 0.97 mg/dL (ref 0.76–1.27)
Globulin, Total: 2.5 g/dL (ref 1.5–4.5)
Glucose: 99 mg/dL (ref 70–99)
Potassium: 4.7 mmol/L (ref 3.5–5.2)
Sodium: 142 mmol/L (ref 134–144)
Total Protein: 6.6 g/dL (ref 6.0–8.5)
eGFR: 75 mL/min/{1.73_m2} (ref >59)

## 2022-06-03 LAB — LIPID PANEL
Chol/HDL Ratio: 2.6 ratio (ref 0.0–5.0)
Cholesterol, Total: 99 mg/dL — ABNORMAL LOW (ref 100–199)
HDL: 38 mg/dL — ABNORMAL LOW (ref >39)
LDL Chol Calc (NIH): 46 mg/dL (ref 0–99)
Triglycerides: 71 mg/dL (ref 0–149)
VLDL Cholesterol Cal: 15 mg/dL (ref 5–40)

## 2022-06-03 LAB — TSH+FREE T4
Free T4: 1.39 ng/dL (ref 0.82–1.77)
TSH: 1.43 u[IU]/mL (ref 0.450–4.500)

## 2022-07-09 ENCOUNTER — Other Ambulatory Visit: Payer: Self-pay | Admitting: Family Medicine

## 2022-07-09 DIAGNOSIS — E039 Hypothyroidism, unspecified: Secondary | ICD-10-CM

## 2022-10-28 ENCOUNTER — Telehealth: Payer: Self-pay | Admitting: Family Medicine

## 2022-10-28 NOTE — Telephone Encounter (Signed)
Called patient to schedule Medicare Annual Wellness Visit (AWV). Left message for patient to call back and schedule Medicare Annual Wellness Visit (AWV).  Last date of AWV: 10/17/2021   Please schedule an appointment at any time with either Mickel Baas or [facility], [facility]. .  If any questions, please contact me at [phone].  Thank you,  Colletta Maryland,  [facility] Program Direct Dial ??[phone]

## 2022-11-19 ENCOUNTER — Telehealth: Payer: Self-pay | Admitting: Family Medicine

## 2022-11-19 NOTE — Telephone Encounter (Signed)
Called patient to schedule Medicare Annual Wellness Visit (AWV). Left message for patient to call back and schedule Medicare Annual Wellness Visit (AWV).  Last date of AWV: 10/21/2021  Please schedule an appointment at any time with either Mickel Baas or [place], NHA's. .  If any questions, please contact me at [phone].  Thank you,  Colletta Maryland,  [place] Program Direct Dial ??[phone]

## 2022-11-25 ENCOUNTER — Telehealth: Payer: Self-pay | Admitting: Family Medicine

## 2022-11-25 NOTE — Telephone Encounter (Signed)
Contacted Arthur Franklin to schedule their annual wellness visit. Appointment made for 12/01/2022.  Thank you,  Judeth Cornfield,  AMB Clinical Support [facility] AWV Program Direct Dial ??[phone]

## 2022-12-01 ENCOUNTER — Ambulatory Visit (INDEPENDENT_AMBULATORY_CARE_PROVIDER_SITE_OTHER): Payer: Medicare HMO

## 2022-12-01 VITALS — Ht 69.0 in | Wt 139.0 lb

## 2022-12-01 DIAGNOSIS — Z0000 Encounter for general adult medical examination without abnormal findings: Secondary | ICD-10-CM | POA: Diagnosis not present

## 2022-12-01 NOTE — Patient Instructions (Signed)
Arthur Franklin , Thank you for taking time to come for your Medicare Wellness Visit. I appreciate your ongoing commitment to your health goals. Please review the following plan we discussed and let me know if I can assist you in the future.   These are the goals we discussed:  Goals      Exercise 150 min/wk Moderate Activity        This is a list of the screening recommended for you and due dates:  Health Maintenance  Topic Date Due   Eye exam for diabetics  Never done   DTaP/Tdap/Td vaccine (1 - Tdap) Never done   Zoster (Shingles) Vaccine (1 of 2) Never done   COVID-19 Vaccine (4 - 2023-24 season) 04/18/2022   Complete foot exam   11/13/2022   Hemoglobin A1C  12/02/2022   Flu Shot  03/19/2023   Medicare Annual Wellness Visit  12/01/2023   Pneumonia Vaccine  Completed   HPV Vaccine  Aged Out    Advanced directives: Advance directive discussed with you today. I have provided a copy for you to complete at home and have notarized. Once this is complete please bring a copy in to our office so we can scan it into your chart.   Conditions/risks identified: Aim for 30 minutes of exercise or brisk walking, 6-8 glasses of water, and 5 servings of fruits and vegetables each day.   Next appointment: Follow up in one year for your annual wellness visit.   Preventive Care 69 Years and Older, Male  Preventive care refers to lifestyle choices and visits with your health care provider that can promote health and wellness. What does preventive care include? A yearly physical exam. This is also called an annual well check. Dental exams once or twice a year. Routine eye exams. Ask your health care provider how often you should have your eyes checked. Personal lifestyle choices, including: Daily care of your teeth and gums. Regular physical activity. Eating a healthy diet. Avoiding tobacco and drug use. Limiting alcohol use. Practicing safe sex. Taking low doses of aspirin every day. Taking  vitamin and mineral supplements as recommended by your health care provider. What happens during an annual well check? The services and screenings done by your health care provider during your annual well check will depend on your age, overall health, lifestyle risk factors, and family history of disease. Counseling  Your health care provider may ask you questions about your: Alcohol use. Tobacco use. Drug use. Emotional well-being. Home and relationship well-being. Sexual activity. Eating habits. History of falls. Memory and ability to understand (cognition). Work and work Astronomer. Screening  You may have the following tests or measurements: Height, weight, and BMI. Blood pressure. Lipid and cholesterol levels. These may be checked every 5 years, or more frequently if you are over 47 years old. Skin check. Lung cancer screening. You may have this screening every year starting at age 61 if you have a 30-pack-year history of smoking and currently smoke or have quit within the past 15 years. Fecal occult blood test (FOBT) of the stool. You may have this test every year starting at age 41. Flexible sigmoidoscopy or colonoscopy. You may have a sigmoidoscopy every 5 years or a colonoscopy every 10 years starting at age 78. Prostate cancer screening. Recommendations will vary depending on your family history and other risks. Hepatitis C blood test. Hepatitis B blood test. Sexually transmitted disease (STD) testing. Diabetes screening. This is done by checking your blood sugar (glucose)  after you have not eaten for a while (fasting). You may have this done every 1-3 years. Abdominal aortic aneurysm (AAA) screening. You may need this if you are a current or former smoker. Osteoporosis. You may be screened starting at age 35 if you are at high risk. Talk with your health care provider about your test results, treatment options, and if necessary, the need for more tests. Vaccines  Your  health care provider may recommend certain vaccines, such as: Influenza vaccine. This is recommended every year. Tetanus, diphtheria, and acellular pertussis (Tdap, Td) vaccine. You may need a Td booster every 10 years. Zoster vaccine. You may need this after age 67. Pneumococcal 13-valent conjugate (PCV13) vaccine. One dose is recommended after age 63. Pneumococcal polysaccharide (PPSV23) vaccine. One dose is recommended after age 55. Talk to your health care provider about which screenings and vaccines you need and how often you need them. This information is not intended to replace advice given to you by your health care provider. Make sure you discuss any questions you have with your health care provider. Document Released: 08/31/2015 Document Revised: 04/23/2016 Document Reviewed: 06/05/2015 Elsevier Interactive Patient Education  2017 [place] Prevention in the Home Falls can cause injuries. They can happen to people of all ages. There are many things you can do to make your home safe and to help prevent falls. What can I do on the outside of my home? Regularly fix the edges of walkways and driveways and fix any cracks. Remove anything that might make you trip as you walk through a door, such as a raised step or threshold. Trim any bushes or trees on the path to your home. Use bright outdoor lighting. Clear any walking paths of anything that might make someone trip, such as rocks or tools. Regularly check to see if handrails are loose or broken. Make sure that both sides of any steps have handrails. Any raised decks and porches should have guardrails on the edges. Have any leaves, snow, or ice cleared regularly. Use sand or salt on walking paths during winter. Clean up any spills in your garage right away. This includes oil or grease spills. What can I do in the bathroom? Use night lights. Install grab bars by the toilet and in the tub and shower. Do not use towel bars as  grab bars. Use non-skid mats or decals in the tub or shower. If you need to sit down in the shower, use a plastic, non-slip stool. Keep the floor dry. Clean up any water that spills on the floor as soon as it happens. Remove soap buildup in the tub or shower regularly. Attach bath mats securely with double-sided non-slip rug tape. Do not have throw rugs and other things on the floor that can make you trip. What can I do in the bedroom? Use night lights. Make sure that you have a light by your bed that is easy to reach. Do not use any sheets or blankets that are too big for your bed. They should not hang down onto the floor. Have a firm chair that has side arms. You can use this for support while you get dressed. Do not have throw rugs and other things on the floor that can make you trip. What can I do in the kitchen? Clean up any spills right away. Avoid walking on wet floors. Keep items that you use a lot in easy-to-reach places. If you need to reach something above you, use a  strong step stool that has a grab bar. Keep electrical cords out of the way. Do not use floor polish or wax that makes floors slippery. If you must use wax, use non-skid floor wax. Do not have throw rugs and other things on the floor that can make you trip. What can I do with my stairs? Do not leave any items on the stairs. Make sure that there are handrails on both sides of the stairs and use them. Fix handrails that are broken or loose. Make sure that handrails are as long as the stairways. Check any carpeting to make sure that it is firmly attached to the stairs. Fix any carpet that is loose or worn. Avoid having throw rugs at the top or bottom of the stairs. If you do have throw rugs, attach them to the floor with carpet tape. Make sure that you have a light switch at the top of the stairs and the bottom of the stairs. If you do not have them, ask someone to add them for you. What else can I do to help prevent  falls? Wear shoes that: Do not have high heels. Have rubber bottoms. Are comfortable and fit you well. Are closed at the toe. Do not wear sandals. If you use a stepladder: Make sure that it is fully opened. Do not climb a closed stepladder. Make sure that both sides of the stepladder are locked into place. Ask someone to hold it for you, if possible. Clearly mark and make sure that you can see: Any grab bars or handrails. First and last steps. Where the edge of each step is. Use tools that help you move around (mobility aids) if they are needed. These include: Canes. Walkers. Scooters. Crutches. Turn on the lights when you go into a dark area. Replace any light bulbs as soon as they burn out. Set up your furniture so you have a clear path. Avoid moving your furniture around. If any of your floors are uneven, fix them. If there are any pets around you, be aware of where they are. Review your medicines with your doctor. Some medicines can make you feel dizzy. This can increase your chance of falling. Ask your doctor what other things that you can do to help prevent falls. This information is not intended to replace advice given to you by your health care provider. Make sure you discuss any questions you have with your health care provider. Document Released: 05/31/2009 Document Revised: 01/10/2016 Document Reviewed: 09/08/2014 Elsevier Interactive Patient Education  2017 [employer].

## 2022-12-01 NOTE — Progress Notes (Signed)
Subjective:   Arthur Franklin is a 89 y.o. male who presents for Medicare Annual/Subsequent preventive examination. I connected with  Haruo Stepanek on 12/01/22 by a audio enabled telemedicine application and verified that I am speaking with the correct person using two identifiers.  Patient Location: Home  Provider Location: Home Office  I discussed the limitations of evaluation and management by telemedicine. The patient expressed understanding and agreed to proceed.  Review of Systems     Cardiac Risk Factors include: advanced age (>63men, >70 women);diabetes mellitus;male gender     Objective:    Today's Vitals   12/01/22 1425  Weight: 139 lb (63 kg)  Height: 5\' 9"  (1.753 m)   Body mass index is 20.53 kg/m.     12/01/2022    2:29 PM 10/17/2021    9:08 AM  Advanced Directives  Does Patient Have a Medical Advance Directive? No Yes  Type of Special educational needs teacher of [place];Living will  Copy of Healthcare Power of Attorney in Chart?  No - copy requested  Would patient like information on creating a medical advance directive? No - Patient declined     Current Medications (verified) Outpatient Encounter Medications as of 12/01/2022  Medication Sig   atorvastatin (LIPITOR) 40 MG tablet Take 1 tablet (40 mg total) by mouth daily.   ibuprofen (ADVIL) 400 MG tablet Take 400 mg by mouth every 6 (six) hours as needed.   levothyroxine (SYNTHROID) 88 MCG tablet TAKE 1 TABLET BY MOUTH ONCE DAILY BEFORE BREAKFAST   metFORMIN (GLUCOPHAGE) 500 MG tablet Take 1 tablet (500 mg total) by mouth daily with breakfast.   No facility-administered encounter medications on file as of 12/01/2022.    Allergies (verified) Patient has no known allergies.   History: Past Medical History:  Diagnosis Date   Diabetes mellitus without complication    Hyperlipidemia    Thyroid disease    History reviewed. No pertinent surgical history. History reviewed. No pertinent family  history. Social History   Socioeconomic History   Marital status: Married    Spouse name: Mitzi Davenport   Number of children: 1   Years of education: Not on file   Highest education level: Not on file  Occupational History   Occupation: retired  Tobacco Use   Smoking status: Never   Smokeless tobacco: Never  Substance and Sexual Activity   Alcohol use: No   Drug use: No   Sexual activity: Not on file  Other Topics Concern   Not on file  Social History Narrative   One daughter, one grandchild - they live nearby   Social Determinants of Health   Financial Resource Strain: Low Risk  (12/01/2022)   Overall Financial Resource Strain (CARDIA)    Difficulty of Paying Living Expenses: Not hard at all  Food Insecurity: No Food Insecurity (12/01/2022)   Hunger Vital Sign    Worried About Running Out of Food in the Last Year: Never true    Ran Out of Food in the Last Year: Never true  Transportation Needs: No Transportation Needs (12/01/2022)   PRAPARE - Administrator, Civil Service (Medical): No    Lack of Transportation (Non-Medical): No  Physical Activity: Insufficiently Active (12/01/2022)   Exercise Vital Sign    Days of Exercise per Week: 3 days    Minutes of Exercise per Session: 30 min  Stress: No Stress Concern Present (12/01/2022)   [employer] of Occupational Health - Occupational Stress Questionnaire  Feeling of Stress : Not at all  Social Connections: Moderately Integrated (12/01/2022)   Social Connection and Isolation Panel [NHANES]    Frequency of Communication with Friends and Family: More than three times a week    Frequency of Social Gatherings with Friends and Family: More than three times a week    Attends Religious Services: More than 4 times per year    Active Member of [employer] or Organizations: No    Attends Engineer, structural: Never    Marital Status: Married    Tobacco Counseling Counseling given: Not Answered   Clinical  Intake:  Pre-visit preparation completed: Yes  Pain : No/denies pain     Nutritional Risks: None Diabetes: Yes CBG done?: No Did pt. bring in CBG monitor from home?: No  How often do you need to have someone help you when you read instructions, pamphlets, or other written materials from your doctor or pharmacy?: 1 - Never  Diabetic?yes Nutrition Risk Assessment:  Has the patient had any N/V/D within the last 2 months?  No  Does the patient have any non-healing wounds?  No  Has the patient had any unintentional weight loss or weight gain?  No   Diabetes:  Is the patient diabetic?  Yes  If diabetic, was a CBG obtained today?  No  Did the patient bring in their glucometer from home?  No  How often do you monitor your CBG's? never.   Financial Strains and Diabetes Management:  Are you having any financial strains with the device, your supplies or your medication? No .  Does the patient want to be seen by Chronic Care Management for management of their diabetes?  No  Would the patient like to be referred to a Nutritionist or for Diabetic Management?  No   Diabetic Exams:  Diabetic Eye Exam: Overdue for diabetic eye exam. Pt has been advised about the importance in completing this exam. Patient advised to call and schedule an eye exam. Diabetic Foot Exam: Overdue, Pt has been advised about the importance in completing this exam. Pt is scheduled for diabetic foot exam on next office visit .   Interpreter Needed?: No  Information entered by :: Renie Ora, LPN   Activities of Daily Living    12/01/2022    2:29 PM  In your present state of health, do you have any difficulty performing the following activities:  Hearing? 0  Vision? 0  Difficulty concentrating or making decisions? 0  Walking or climbing stairs? 0  Dressing or bathing? 0  Doing errands, shopping? 0  Preparing Food and eating ? N  Using the Toilet? N  In the past six months, have you accidently leaked  urine? N  Do you have problems with loss of bowel control? N  Managing your Medications? N  Managing your Finances? N  Housekeeping or managing your Housekeeping? N    Patient Care Team: Mechele Claude, MD as PCP - General (Family Medicine)  Indicate any recent Medical Services you may have received from other than [facility] providers in the past year (date may be approximate).     Assessment:   This is a routine wellness examination for [place].  Hearing/Vision screen Vision Screening - Comments:: Patient declines referral   Dietary issues and exercise activities discussed: Current Exercise Habits: Home exercise routine, Type of exercise: walking, Time (Minutes): 30, Frequency (Times/Week): 3, Weekly Exercise (Minutes/Week): 90, Intensity: Mild, Exercise limited by: None identified   Goals Addressed  This Visit's Progress    Exercise 150 min/wk Moderate Activity   On track      Depression Screen    12/01/2022    2:28 PM 06/02/2022    2:23 PM 02/10/2022    2:41 PM 11/12/2021    4:19 PM 10/17/2021    9:07 AM 03/04/2021    9:39 AM 07/03/2020    4:16 PM  PHQ 2/9 Scores  PHQ - 2 Score 0 0 0 0 0 0 0  PHQ- 9 Score   0        Fall Risk    12/01/2022    2:27 PM 06/02/2022    2:23 PM 02/10/2022    2:41 PM 11/12/2021    4:19 PM 10/17/2021    9:05 AM  Fall Risk   Falls in the past year? 0 0 0 0 0  Number falls in past yr: 0    0  Injury with Fall? 0    0  Risk for fall due to : No Fall Risks    No Fall Risks  Follow up Falls prevention discussed    Falls prevention discussed    FALL RISK PREVENTION PERTAINING TO THE HOME:  Any stairs in or around the home? Yes  If so, are there any without handrails? No  Home free of loose throw rugs in walkways, pet beds, electrical cords, etc? Yes  Adequate lighting in your home to reduce risk of falls? Yes   ASSISTIVE DEVICES UTILIZED TO PREVENT FALLS:  Life alert? No  Use of a cane, walker or w/c? No  Grab bars in the  bathroom? Yes  Shower chair or bench in shower? No  Elevated toilet seat or a handicapped toilet? No          12/01/2022    2:29 PM 10/17/2021    9:10 AM  6CIT Screen  What Year? 0 points 0 points  What month? 0 points 0 points  What time? 0 points 0 points  Count back from 20 0 points 0 points  Months in reverse 0 points 0 points  Repeat phrase 0 points 4 points  Total Score 0 points 4 points    Immunizations Immunization History  Administered Date(s) Administered   Influenza Split 06/17/2013   Influenza, High Dose Seasonal PF 06/22/2015, 07/07/2018, 06/17/2022   Influenza,inj,Quad PF,6+ Mos 06/06/2014, 05/20/2016   Influenza-Unspecified 07/03/2017, 06/14/2019, 07/17/2020   Moderna Sars-Covid-2 Vaccination 10/03/2019, 10/31/2019, 08/07/2020   Pneumococcal Conjugate-13 10/23/2017   Pneumococcal Polysaccharide-23 07/17/2020    TDAP status: Due, Education has been provided regarding the importance of this vaccine. Advised may receive this vaccine at local pharmacy or Health Dept. Aware to provide a copy of the vaccination record if obtained from local pharmacy or Health Dept. Verbalized acceptance and understanding.  Flu Vaccine status: Up to date  Pneumococcal vaccine status: Up to date  Covid-19 vaccine status: Completed vaccines  Qualifies for Shingles Vaccine? Yes   Zostavax completed No   Shingrix Completed?: No.    Education has been provided regarding the importance of this vaccine. Patient has been advised to call insurance company to determine out of pocket expense if they have not yet received this vaccine. Advised may also receive vaccine at local pharmacy or Health Dept. Verbalized acceptance and understanding.  Screening Tests Health Maintenance  Topic Date Due   OPHTHALMOLOGY EXAM  Never done   DTaP/Tdap/Td (1 - Tdap) Never done   Zoster Vaccines- Shingrix (1 of 2) Never done   COVID-19 Vaccine (  4 - 2023-24 season) 04/18/2022   FOOT EXAM  11/13/2022    HEMOGLOBIN A1C  12/02/2022   INFLUENZA VACCINE  03/19/2023   Medicare Annual Wellness (AWV)  12/01/2023   Pneumonia Vaccine 29+ Years old  Completed   HPV VACCINES  Aged Out    Health Maintenance  Health Maintenance Due  Topic Date Due   OPHTHALMOLOGY EXAM  Never done   DTaP/Tdap/Td (1 - Tdap) Never done   Zoster Vaccines- Shingrix (1 of 2) Never done   COVID-19 Vaccine (4 - 2023-24 season) 04/18/2022   FOOT EXAM  11/13/2022    Colorectal cancer screening: No longer required.   Lung Cancer Screening: (Low Dose CT Chest recommended if Age 32-80 years, 30 pack-year currently smoking OR have quit w/in 15years.) does not qualify.   Lung Cancer Screening Referral: n/a  Additional Screening:  Hepatitis C Screening: does not qualify;   Vision Screening: Recommended annual ophthalmology exams for early detection of glaucoma and other disorders of the eye. Is the patient up to date with their annual eye exam?  No  Who is the provider or what is the name of the office in which the patient attends annual eye exams? No patient declines  If pt is not established with a provider, would they like to be referred to a provider to establish care? No .   Dental Screening: Recommended annual dental exams for proper oral hygiene  Community Resource Referral / Chronic Care Management: CRR required this visit?  No   CCM required this visit?  No      Plan:     I have personally reviewed and noted the following in the patient's chart:   Medical and social history Use of alcohol, tobacco or illicit drugs  Current medications and supplements including opioid prescriptions. Patient is not currently taking opioid prescriptions. Functional ability and status Nutritional status Physical activity Advanced directives List of other physicians Hospitalizations, surgeries, and ER visits in previous 12 months Vitals Screenings to include cognitive, depression, and falls Referrals and  appointments  In addition, I have reviewed and discussed with patient certain preventive protocols, quality metrics, and best practice recommendations. A written personalized care plan for preventive services as well as general preventive health recommendations were provided to patient.     Lorrene Reid, LPN   1/61/0960   Nurse Notes: Due TDAP Vaccine

## 2023-06-08 ENCOUNTER — Telehealth: Payer: Self-pay | Admitting: Family Medicine

## 2023-06-08 ENCOUNTER — Other Ambulatory Visit: Payer: Self-pay | Admitting: *Deleted

## 2023-06-08 DIAGNOSIS — E782 Mixed hyperlipidemia: Secondary | ICD-10-CM

## 2023-06-08 DIAGNOSIS — E039 Hypothyroidism, unspecified: Secondary | ICD-10-CM

## 2023-06-08 DIAGNOSIS — E119 Type 2 diabetes mellitus without complications: Secondary | ICD-10-CM

## 2023-06-08 MED ORDER — LEVOTHYROXINE SODIUM 88 MCG PO TABS: 88.0000 ug | ORAL_TABLET | Freq: Every day | ORAL | 0 refills | Status: DC

## 2023-06-08 MED ORDER — ATORVASTATIN CALCIUM 40 MG PO TABS: 40.0000 mg | ORAL_TABLET | Freq: Every day | ORAL | 0 refills | Status: DC

## 2023-06-08 MED ORDER — METFORMIN HCL 500 MG PO TABS: 500.0000 mg | ORAL_TABLET | Freq: Every day | ORAL | 0 refills | Status: DC

## 2023-06-08 NOTE — Telephone Encounter (Signed)
DONE

## 2023-06-08 NOTE — Telephone Encounter (Signed)
  Prescription Request  06/08/2023   What is the name of the medication or equipment? ALL  Have you contacted your pharmacy to request a refill? YES  Which pharmacy would you like this sent to? [facility]   Pt scheduled to see PCP for med refill appt on 11/25. Needs refill sent in to last him until this appt.   Patient notified that their request is being sent to the clinical staff for review and that they should receive a response within 2 business days.

## 2023-07-13 ENCOUNTER — Encounter: Payer: Self-pay | Admitting: Family Medicine

## 2023-07-13 ENCOUNTER — Ambulatory Visit (INDEPENDENT_AMBULATORY_CARE_PROVIDER_SITE_OTHER): Payer: Medicare HMO | Admitting: Family Medicine

## 2023-07-13 VITALS — Ht 69.0 in | Wt 136.4 lb

## 2023-07-13 DIAGNOSIS — E782 Mixed hyperlipidemia: Secondary | ICD-10-CM | POA: Diagnosis not present

## 2023-07-13 DIAGNOSIS — E119 Type 2 diabetes mellitus without complications: Secondary | ICD-10-CM

## 2023-07-13 DIAGNOSIS — E039 Hypothyroidism, unspecified: Secondary | ICD-10-CM

## 2023-07-13 DIAGNOSIS — Z7984 Long term (current) use of oral hypoglycemic drugs: Secondary | ICD-10-CM | POA: Diagnosis not present

## 2023-07-13 DIAGNOSIS — D485 Neoplasm of uncertain behavior of skin: Secondary | ICD-10-CM | POA: Diagnosis not present

## 2023-07-13 LAB — BAYER DCA HB A1C WAIVED: HB A1C (BAYER DCA - WAIVED): 5.9 % — ABNORMAL HIGH (ref 4.8–5.6)

## 2023-07-13 MED ORDER — ATORVASTATIN CALCIUM 40 MG PO TABS: 40.0000 mg | ORAL_TABLET | Freq: Every day | ORAL | 3 refills | Status: DC

## 2023-07-13 MED ORDER — LEVOTHYROXINE SODIUM 88 MCG PO TABS: 88.0000 ug | ORAL_TABLET | Freq: Every day | ORAL | 3 refills | Status: DC

## 2023-07-13 MED ORDER — METFORMIN HCL 500 MG PO TABS: 500.0000 mg | ORAL_TABLET | Freq: Every day | ORAL | 3 refills | Status: DC

## 2023-07-13 NOTE — Progress Notes (Signed)
Subjective:  Patient ID: Arthur Franklin,  male    DOB: 06/15/1932  Age: 89 y.o.    CC: Medical Management of Chronic Issues   HPI Arthur Franklin presents for  follow-up of hypertension. Patient has no history of headache chest pain or shortness of breath or recent cough. Patient also denies symptoms of TIA such as numbness weakness lateralizing. Patient denies side effects from medication. States taking it regularly.  Patient also  in for follow-up of elevated cholesterol. Doing well without complaints on current medication. Denies side effects  including myalgia and arthralgia and nausea. Also in today for liver function testing. Currently no chest pain, shortness of breath or other cardiovascular related symptoms noted.  Follow-up of diabetes. Patient does  not check blood sugar at home. Patient denies symptoms such as excessive hunger or urinary frequency, excessive hunger, nausea No significant hypoglycemic spells noted. Medications reviewed. Pt reports taking them regularly. Pt. denies complication/adverse reaction today.    History Arthur Franklin has a past medical history of Diabetes mellitus without complication (HCC), Hyperlipidemia, and Thyroid disease.   Arthur Franklin has no past surgical history on file.   His family history is not on file.Arthur Franklin reports that Arthur Franklin has never smoked. Arthur Franklin has never used smokeless tobacco. Arthur Franklin reports that Arthur Franklin does not drink alcohol and does not use drugs.  Current Outpatient Medications on File Prior to Visit  Medication Sig Dispense Refill   ibuprofen (ADVIL) 400 MG tablet Take 400 mg by mouth every 6 (six) hours as needed.     No current facility-administered medications on file prior to visit.    ROS Review of Systems  Constitutional:  Negative for fever.  Respiratory:  Negative for shortness of breath.   Cardiovascular:  Negative for chest pain.  Musculoskeletal:  Negative for arthralgias.  Skin:  Negative for rash.    Objective:  Ht 5\' 9"  (1.753 m)    Wt 136 lb 6.4 oz (61.9 kg)   BMI 20.14 kg/m   BP Readings from Last 3 Encounters:  06/02/22 126/62  02/10/22 140/73  11/12/21 131/66    Wt Readings from Last 3 Encounters:  07/13/23 136 lb 6.4 oz (61.9 kg)  12/01/22 139 lb (63 kg)  06/02/22 139 lb 6.4 oz (63.2 kg)     Physical Exam Vitals reviewed.  Constitutional:      Appearance: Arthur Franklin is well-developed.  HENT:     Head: Normocephalic and atraumatic.     Right Ear: External ear normal.     Left Ear: External ear normal.     Mouth/Throat:     Pharynx: No oropharyngeal exudate or posterior oropharyngeal erythema.  Eyes:     Pupils: Pupils are equal, round, and reactive to light.  Cardiovascular:     Rate and Rhythm: Normal rate and regular rhythm.     Heart sounds: No murmur heard. Pulmonary:     Effort: No respiratory distress.     Breath sounds: Normal breath sounds.  Musculoskeletal:     Cervical back: Normal range of motion and neck supple.  Skin:    Findings: Lesion (scaly eruption, left cheek) present.  Neurological:     Mental Status: Arthur Franklin is alert and oriented to person, place, and time.     Diabetic Foot Exam - Simple   No data filed     Lab Results  Component Value Date   HGBA1C 5.9 (H) 07/13/2023   HGBA1C 6.1 (H) 06/02/2022   HGBA1C 5.6 11/12/2021    Assessment &  Plan:   Arthur Franklin was seen today for medical management of chronic issues.  Diagnoses and all orders for this visit:  Neoplasm of uncertain behavior of skin  Mixed hyperlipidemia -     Lipid panel -     atorvastatin (LIPITOR) 40 MG tablet; Take 1 tablet (40 mg total) by mouth daily.  Hypothyroidism, unspecified type -     TSH + free T4 -     levothyroxine (SYNTHROID) 88 MCG tablet; Take 1 tablet (88 mcg total) by mouth daily before breakfast.  Type 2 diabetes mellitus without complication, without long-term current use of insulin (HCC) -     Bayer DCA Hb A1c Waived -     CBC with Differential/Platelet -     CMP14+EGFR -      metFORMIN (GLUCOPHAGE) 500 MG tablet; Take 1 tablet (500 mg total) by mouth daily with breakfast.   I am having Arthur Franklin maintain his ibuprofen, atorvastatin, levothyroxine, and metFORMIN.  Meds ordered this encounter  Medications   atorvastatin (LIPITOR) 40 MG tablet    Sig: Take 1 tablet (40 mg total) by mouth daily.    Dispense:  90 tablet    Refill:  3   levothyroxine (SYNTHROID) 88 MCG tablet    Sig: Take 1 tablet (88 mcg total) by mouth daily before breakfast.    Dispense:  90 tablet    Refill:  3   metFORMIN (GLUCOPHAGE) 500 MG tablet    Sig: Take 1 tablet (500 mg total) by mouth daily with breakfast.    Dispense:  90 tablet    Refill:  3     Follow-up: Return in about 6 months (around 01/10/2024).  Mechele Claude, M.D.

## 2023-07-14 LAB — CBC WITH DIFFERENTIAL/PLATELET
Basophils Absolute: 0.1 10*3/uL (ref 0.0–0.2)
Basos: 1 %
EOS (ABSOLUTE): 0.3 10*3/uL (ref 0.0–0.4)
Eos: 5 %
Hematocrit: 42.7 % (ref 37.5–51.0)
Hemoglobin: 13.8 g/dL (ref 13.0–17.7)
Immature Grans (Abs): 0 10*3/uL (ref 0.0–0.1)
Immature Granulocytes: 0 %
Lymphocytes Absolute: 2.6 10*3/uL (ref 0.7–3.1)
Lymphs: 42 %
MCH: 31.1 pg (ref 26.6–33.0)
MCHC: 32.3 g/dL (ref 31.5–35.7)
MCV: 96 fL (ref 79–97)
Monocytes Absolute: 0.6 10*3/uL (ref 0.1–0.9)
Monocytes: 10 %
Neutrophils Absolute: 2.6 10*3/uL (ref 1.4–7.0)
Neutrophils: 42 %
Platelets: 172 10*3/uL (ref 150–450)
RBC: 4.44 x10E6/uL (ref 4.14–5.80)
RDW: 12.7 % (ref 11.6–15.4)
WBC: 6.1 10*3/uL (ref 3.4–10.8)

## 2023-07-14 LAB — CMP14+EGFR
ALT: 22 [IU]/L (ref 0–44)
AST: 25 [IU]/L (ref 0–40)
Albumin: 4.1 g/dL (ref 3.7–4.7)
Alkaline Phosphatase: 89 [IU]/L (ref 44–121)
BUN/Creatinine Ratio: 13 (ref 10–24)
BUN: 15 mg/dL (ref 8–27)
Bilirubin Total: 0.4 mg/dL (ref 0.0–1.2)
CO2: 25 mmol/L (ref 20–29)
Calcium: 9.8 mg/dL (ref 8.6–10.2)
Chloride: 103 mmol/L (ref 96–106)
Creatinine, Ser: 1.15 mg/dL (ref 0.76–1.27)
Globulin, Total: 2.7 g/dL (ref 1.5–4.5)
Glucose: 119 mg/dL — ABNORMAL HIGH (ref 70–99)
Potassium: 4.5 mmol/L (ref 3.5–5.2)
Sodium: 142 mmol/L (ref 134–144)
Total Protein: 6.8 g/dL (ref 6.0–8.5)
eGFR: 61 mL/min/{1.73_m2} (ref >59)

## 2023-07-14 LAB — TSH+FREE T4
Free T4: 1.49 ng/dL (ref 0.82–1.77)
TSH: 2.14 u[IU]/mL (ref 0.450–4.500)

## 2023-07-14 LAB — LIPID PANEL
Chol/HDL Ratio: 2.6 {ratio} (ref 0.0–5.0)
Cholesterol, Total: 121 mg/dL (ref 100–199)
HDL: 46 mg/dL (ref >39)
LDL Chol Calc (NIH): 62 mg/dL (ref 0–99)
Triglycerides: 62 mg/dL (ref 0–149)
VLDL Cholesterol Cal: 13 mg/dL (ref 5–40)

## 2023-07-14 NOTE — Progress Notes (Signed)
Hello Nickolaos,  Your lab result is normal and/or stable.Some minor variations that are not significant are commonly marked abnormal, but do not represent any medical problem for you.  Best regards, Kaimana Neuzil, M.D.

## 2023-08-18 DIAGNOSIS — L570 Actinic keratosis: Secondary | ICD-10-CM | POA: Diagnosis not present

## 2023-08-18 DIAGNOSIS — D485 Neoplasm of uncertain behavior of skin: Secondary | ICD-10-CM | POA: Diagnosis not present

## 2023-08-18 DIAGNOSIS — D0439 Carcinoma in situ of skin of other parts of face: Secondary | ICD-10-CM | POA: Diagnosis not present

## 2023-08-27 DIAGNOSIS — C44529 Squamous cell carcinoma of skin of other part of trunk: Secondary | ICD-10-CM | POA: Diagnosis not present

## 2023-12-02 ENCOUNTER — Ambulatory Visit: Payer: Medicare HMO

## 2023-12-02 VITALS — Ht 69.0 in | Wt 136.0 lb

## 2023-12-02 DIAGNOSIS — Z0000 Encounter for general adult medical examination without abnormal findings: Secondary | ICD-10-CM

## 2023-12-02 NOTE — Progress Notes (Signed)
 Subjective:   Arthur Franklin is a 89 y.o. who presents for a Medicare Wellness preventive visit.  Visit Complete: Virtual I connected with  Arthur Franklin on 12/02/23 by a audio enabled telemedicine application and verified that I am speaking with the correct person using two identifiers.  Patient Location: Home  Provider Location: Home Office  I discussed the limitations of evaluation and management by telemedicine. The patient expressed understanding and agreed to proceed.  Vital Signs: Because this visit was a virtual/telehealth visit, some criteria may be missing or patient reported. Any vitals not documented were not able to be obtained and vitals that have been documented are patient reported.  VideoDeclined- This patient declined Librarian, academic. Therefore the visit was completed with audio only.  Persons Participating in Visit: Patient.  AWV Questionnaire: No: Patient Medicare AWV questionnaire was not completed prior to this visit.  Cardiac Risk Factors include: advanced age (>52men, >25 women);diabetes mellitus;male gender     Objective:    Today's Vitals   12/02/23 1459  Weight: 136 lb (61.7 kg)  Height: 5\' 9"  (1.753 m)   Body mass index is 20.08 kg/m.     12/02/2023    3:04 PM 12/01/2022    2:29 PM 10/17/2021    9:08 AM  Advanced Directives  Does Patient Have a Medical Advance Directive? No No Yes  Type of Best boy of [place];Living will  Copy of Healthcare Power of Attorney in Chart?   No - copy requested  Would patient like information on creating a medical advance directive? Yes (MAU/Ambulatory/Procedural Areas - Information given) No - Patient declined     Current Medications (verified) Outpatient Encounter Medications as of 12/02/2023  Medication Sig   atorvastatin (LIPITOR) 40 MG tablet Take 1 tablet (40 mg total) by mouth daily.   ibuprofen (ADVIL) 400 MG tablet Take 400 mg by mouth every 6  (six) hours as needed.   levothyroxine (SYNTHROID) 88 MCG tablet Take 1 tablet (88 mcg total) by mouth daily before breakfast.   metFORMIN (GLUCOPHAGE) 500 MG tablet Take 1 tablet (500 mg total) by mouth daily with breakfast.   No facility-administered encounter medications on file as of 12/02/2023.    Allergies (verified) Patient has no known allergies.   History: Past Medical History:  Diagnosis Date   Diabetes mellitus without complication (HCC)    Hyperlipidemia    Thyroid disease    No past surgical history on file. No family history on file. Social History   Socioeconomic History   Marital status: Married    Spouse name: Mitzi Davenport   Number of children: 1   Years of education: Not on file   Highest education level: Not on file  Occupational History   Occupation: retired  Tobacco Use   Smoking status: Never   Smokeless tobacco: Never  Substance and Sexual Activity   Alcohol use: No   Drug use: No   Sexual activity: Not on file  Other Topics Concern   Not on file  Social History Narrative   One daughter, one grandchild - they live nearby   Social Drivers of Corporate investment banker Strain: Low Risk  (12/02/2023)   Overall Financial Resource Strain (CARDIA)    Difficulty of Paying Living Expenses: Not hard at all  Food Insecurity: No Food Insecurity (12/02/2023)   Hunger Vital Sign    Worried About Running Out of Food in the Last Year: Never true  Ran Out of Food in the Last Year: Never true  Transportation Needs: No Transportation Needs (12/02/2023)   PRAPARE - Administrator, Civil Service (Medical): No    Lack of Transportation (Non-Medical): No  Physical Activity: Insufficiently Active (12/02/2023)   Exercise Vital Sign    Days of Exercise per Week: 3 days    Minutes of Exercise per Session: 30 min  Stress: No Stress Concern Present (12/02/2023)   [employer] of Occupational Health - Occupational Stress Questionnaire    Feeling of Stress  : Not at all  Social Connections: Moderately Integrated (12/02/2023)   Social Connection and Isolation Panel [NHANES]    Frequency of Communication with Friends and Family: More than three times a week    Frequency of Social Gatherings with Friends and Family: More than three times a week    Attends Religious Services: More than 4 times per year    Active Member of [employer] or Organizations: No    Attends Banker Meetings: Never    Marital Status: Married    Tobacco Counseling Counseling given: Not Answered    Clinical Intake:  Pre-visit preparation completed: Yes  Pain : No/denies pain     Diabetes: No  Lab Results  Component Value Date   HGBA1C 5.9 (H) 07/13/2023   HGBA1C 6.1 (H) 06/02/2022   HGBA1C 5.6 11/12/2021     How often do you need to have someone help you when you read instructions, pamphlets, or other written materials from your doctor or pharmacy?: 1 - Never  Interpreter Needed?: No  Information entered by :: Kandis Fantasia LPN   Activities of Daily Living     12/02/2023    3:00 PM  In your present state of health, do you have any difficulty performing the following activities:  Hearing? 0  Vision? 0  Difficulty concentrating or making decisions? 0  Walking or climbing stairs? 0  Dressing or bathing? 0  Doing errands, shopping? 0  Preparing Food and eating ? N  Using the Toilet? N  In the past six months, have you accidently leaked urine? N  Do you have problems with loss of bowel control? N  Managing your Medications? N  Managing your Finances? N  Housekeeping or managing your Housekeeping? N    Patient Care Team: Mechele Claude, MD as PCP - General (Family Medicine)  Indicate any recent Medical Services you may have received from other than [facility] providers in the past year (date may be approximate).     Assessment:   This is a routine wellness examination for [place].  Hearing/Vision screen Hearing Screening - Comments:: Denies  hearing difficulties   Vision Screening - Comments:: No vision problems; will schedule routine eye exam soon     Goals Addressed             This Visit's Progress    Remain active and independent         Depression Screen     12/02/2023    3:03 PM 07/13/2023    1:16 PM 12/01/2022    2:28 PM 06/02/2022    2:23 PM 02/10/2022    2:41 PM 11/12/2021    4:19 PM 10/17/2021    9:07 AM  PHQ 2/9 Scores  PHQ - 2 Score 0 0 0 0 0 0 0  PHQ- 9 Score     0      Fall Risk     12/02/2023    3:05 PM 07/13/2023  1:16 PM 12/01/2022    2:27 PM 06/02/2022    2:23 PM 02/10/2022    2:41 PM  Fall Risk   Falls in the past year? 0 0 0 0 0  Number falls in past yr: 0  0    Injury with Fall? 0  0    Risk for fall due to : No Fall Risks  No Fall Risks    Follow up Falls prevention discussed;Education provided;Falls evaluation completed  Falls prevention discussed      MEDICARE RISK AT HOME:  Medicare Risk at Home Any stairs in or around the home?: No If so, are there any without handrails?: No Home free of loose throw rugs in walkways, pet beds, electrical cords, etc?: Yes Adequate lighting in your home to reduce risk of falls?: Yes Life alert?: No Use of a cane, walker or w/c?: No Grab bars in the bathroom?: Yes Shower chair or bench in shower?: No Elevated toilet seat or a handicapped toilet?: Yes  TIMED UP AND GO:  Was the test performed?  No  Cognitive Function: 6CIT completed        12/02/2023    3:05 PM 12/01/2022    2:29 PM 10/17/2021    9:10 AM  6CIT Screen  What Year? 0 points 0 points 0 points  What month? 0 points 0 points 0 points  What time? 0 points 0 points 0 points  Count back from 20 0 points 0 points 0 points  Months in reverse 0 points 0 points 0 points  Repeat phrase 0 points 0 points 4 points  Total Score 0 points 0 points 4 points    Immunizations Immunization History  Administered Date(s) Administered   Influenza Split 06/17/2013   Influenza, High Dose  Seasonal PF 06/22/2015, 07/07/2018, 06/17/2022, 06/16/2023   Influenza,inj,Quad PF,6+ Mos 06/06/2014, 05/20/2016   Influenza-Unspecified 07/03/2017, 06/14/2019, 07/17/2020   Moderna Sars-Covid-2 Vaccination 10/03/2019, 10/31/2019, 08/07/2020   Pneumococcal Conjugate-13 10/23/2017   Pneumococcal Polysaccharide-23 07/17/2020    Screening Tests Health Maintenance  Topic Date Due   OPHTHALMOLOGY EXAM  Never done   Zoster Vaccines- Shingrix (1 of 2) Never done   FOOT EXAM  11/13/2022   COVID-19 Vaccine (4 - 2024-25 season) 04/19/2023   DTaP/Tdap/Td (1 - Tdap) 07/12/2024 (Originally 05/26/1951)   HEMOGLOBIN A1C  01/10/2024   INFLUENZA VACCINE  03/18/2024   Medicare Annual Wellness (AWV)  12/01/2024   Pneumonia Vaccine 19+ Years old  Completed   HPV VACCINES  Aged Out   Meningococcal B Vaccine  Aged Out    Health Maintenance  Health Maintenance Due  Topic Date Due   OPHTHALMOLOGY EXAM  Never done   Zoster Vaccines- Shingrix (1 of 2) Never done   FOOT EXAM  11/13/2022   COVID-19 Vaccine (4 - 2024-25 season) 04/19/2023    Additional Screening:  Vision Screening: Recommended annual ophthalmology exams for early detection of glaucoma and other disorders of the eye.  Dental Screening: Recommended annual dental exams for proper oral hygiene  Community Resource Referral / Chronic Care Management: CRR required this visit?  No   CCM required this visit?  No     Plan:     I have personally reviewed and noted the following in the patient's chart:   Medical and social history Use of alcohol, tobacco or illicit drugs  Current medications and supplements including opioid prescriptions. Patient is not currently taking opioid prescriptions. Functional ability and status Nutritional status Physical activity Advanced directives List of other physicians  Hospitalizations, surgeries, and ER visits in previous 12 months Vitals Screenings to include cognitive, depression, and  falls Referrals and appointments  In addition, I have reviewed and discussed with patient certain preventive protocols, quality metrics, and best practice recommendations. A written personalized care plan for preventive services as well as general preventive health recommendations were provided to patient.     [place], [place]   1/61/0960   After Visit Summary: (MyChart) Due to this being a telephonic visit, the after visit summary with patients personalized plan was offered to patient via MyChart   Notes: Nothing significant to report at this time.

## 2023-12-02 NOTE — Patient Instructions (Signed)
 Mr. Lineman , Thank you for taking time to come for your Medicare Wellness Visit. I appreciate your ongoing commitment to your health goals. Please review the following plan we discussed and let me know if I can assist you in the future.   Referrals/Orders/Follow-Ups/Clinician Recommendations: Aim for 30 minutes of exercise or brisk walking, 6-8 glasses of water, and 5 servings of fruits and vegetables each day.  This is a list of the screening recommended for you and due dates:  Health Maintenance  Topic Date Due   Eye exam for diabetics  Never done   Zoster (Shingles) Vaccine (1 of 2) Never done   Complete foot exam   11/13/2022   COVID-19 Vaccine (4 - 2024-25 season) 04/19/2023   DTaP/Tdap/Td vaccine (1 - Tdap) 07/12/2024*   Hemoglobin A1C  01/10/2024   Flu Shot  03/18/2024   Medicare Annual Wellness Visit  12/01/2024   Pneumonia Vaccine  Completed   HPV Vaccine  Aged Out   Meningitis B Vaccine  Aged Out  *Topic was postponed. The date shown is not the original due date.    Advanced directives: (ACP Link)Information on Advanced Care Planning can be found at [place]  Secretary of [facility] Advance Health Care Directives Advance Health Care Directives. [email or web address]   Next Medicare Annual Wellness Visit scheduled for next year: Yes

## 2024-01-12 ENCOUNTER — Ambulatory Visit (INDEPENDENT_AMBULATORY_CARE_PROVIDER_SITE_OTHER): Admitting: Family Medicine

## 2024-01-12 ENCOUNTER — Ambulatory Visit: Payer: Self-pay | Admitting: Family Medicine

## 2024-01-12 ENCOUNTER — Encounter: Payer: Self-pay | Admitting: Family Medicine

## 2024-01-12 VITALS — BP 148/65 | HR 44 | Temp 97.1°F | Ht 69.0 in | Wt 136.0 lb

## 2024-01-12 DIAGNOSIS — E119 Type 2 diabetes mellitus without complications: Secondary | ICD-10-CM

## 2024-01-12 DIAGNOSIS — E782 Mixed hyperlipidemia: Secondary | ICD-10-CM | POA: Diagnosis not present

## 2024-01-12 DIAGNOSIS — E1169 Type 2 diabetes mellitus with other specified complication: Secondary | ICD-10-CM | POA: Diagnosis not present

## 2024-01-12 DIAGNOSIS — E039 Hypothyroidism, unspecified: Secondary | ICD-10-CM | POA: Diagnosis not present

## 2024-01-12 LAB — BAYER DCA HB A1C WAIVED: HB A1C (BAYER DCA - WAIVED): 5.6 % (ref 4.8–5.6)

## 2024-01-12 LAB — LIPID PANEL

## 2024-01-12 NOTE — Progress Notes (Signed)
 Subjective:  Patient ID: Arthur Franklin,  male    DOB: 05-03-1932  Age: 89 y.o.    CC: Medical Management of Chronic Issues   HPI Arthur Franklin presents for  follow-up of elevated cholesterol. Doing well without complaints on current medication. Denies side effects  including myalgia and arthralgia and nausea. Also in today for liver function testing. Currently no chest pain, shortness of breath or other cardiovascular related symptoms noted.  Follow-up of diabetes. Patient does not check blood sugar at home.  Patient denies symptoms such as excessive hunger or urinary frequency, excessive hunger, nausea No significant hypoglycemic spells noted. Medications reviewed. Pt reports taking them regularly. Pt. denies complication/adverse reaction today.    History Arthur Franklin has a past medical history of Diabetes mellitus without complication (HCC), Hyperlipidemia, and Thyroid  disease.   He has no past surgical history on file.   His family history is not on file.He reports that he has never smoked. He has never used smokeless tobacco. He reports that he does not drink alcohol and does not use drugs.  Current Outpatient Medications on File Prior to Visit  Medication Sig Dispense Refill   atorvastatin  (LIPITOR) 40 MG tablet Take 1 tablet (40 mg total) by mouth daily. 90 tablet 3   levothyroxine  (SYNTHROID ) 88 MCG tablet Take 1 tablet (88 mcg total) by mouth daily before breakfast. 90 tablet 3   No current facility-administered medications on file prior to visit.    ROS Review of Systems  Constitutional:  Negative for fever.  Respiratory:  Negative for shortness of breath.   Cardiovascular:  Negative for chest pain.  Musculoskeletal:  Negative for arthralgias.  Skin:  Negative for rash.    Objective:  BP (!) 148/65   Pulse (!) 44   Temp (!) 97.1 F (36.2 C) (Temporal)   Ht 5\' 9"  (1.753 m)   Wt 136 lb (61.7 kg)   SpO2 96%   BMI 20.08 kg/m   BP Readings from Last 3  Encounters:  01/12/24 (!) 148/65  06/02/22 126/62  02/10/22 140/73    Wt Readings from Last 3 Encounters:  01/12/24 136 lb (61.7 kg)  12/02/23 136 lb (61.7 kg)  07/13/23 136 lb 6.4 oz (61.9 kg)    Lab Results  Component Value Date   HGBA1C 5.6 01/12/2024   HGBA1C 5.9 (H) 07/13/2023   HGBA1C 6.1 (H) 06/02/2022    Physical Exam Vitals reviewed.  Constitutional:      Appearance: He is well-developed.  HENT:     Head: Normocephalic and atraumatic.     Right Ear: External ear normal.     Left Ear: External ear normal.     Mouth/Throat:     Pharynx: No oropharyngeal exudate or posterior oropharyngeal erythema.  Eyes:     Pupils: Pupils are equal, round, and reactive to light.  Cardiovascular:     Rate and Rhythm: Normal rate and regular rhythm.     Heart sounds: No murmur heard. Pulmonary:     Effort: No respiratory distress.     Breath sounds: Normal breath sounds.  Musculoskeletal:     Cervical back: Normal range of motion and neck supple.  Neurological:     Mental Status: He is alert and oriented to person, place, and time.         Assessment & Plan:  Mixed hyperlipidemia -     Lipid panel  Type 2 diabetes mellitus without complication, without long-term current use of insulin (HCC) -  Bayer DCA Hb A1c Waived -     CBC with Differential/Platelet -     CMP14+EGFR -     Vitamin B12  Hypothyroidism, unspecified type    Follow-up: Return in about 6 months (around 07/14/2024).  Roise Cleaver, M.D.

## 2024-01-13 LAB — CMP14+EGFR
ALT: 17 IU/L (ref 0–44)
AST: 21 IU/L (ref 0–40)
Albumin: 4.2 g/dL (ref 3.7–4.7)
Alkaline Phosphatase: 88 IU/L (ref 44–121)
BUN/Creatinine Ratio: 14 (ref 10–24)
BUN: 15 mg/dL (ref 8–27)
Bilirubin Total: 0.4 mg/dL (ref 0.0–1.2)
CO2: 25 mmol/L (ref 20–29)
Calcium: 9.3 mg/dL (ref 8.6–10.2)
Chloride: 102 mmol/L (ref 96–106)
Creatinine, Ser: 1.06 mg/dL (ref 0.76–1.27)
Globulin, Total: 2.5 g/dL (ref 1.5–4.5)
Glucose: 86 mg/dL (ref 70–99)
Potassium: 4.3 mmol/L (ref 3.5–5.2)
Sodium: 140 mmol/L (ref 134–144)
Total Protein: 6.7 g/dL (ref 6.0–8.5)
eGFR: 67 mL/min/{1.73_m2} (ref >59)

## 2024-01-13 LAB — CBC WITH DIFFERENTIAL/PLATELET
Basophils Absolute: 0.1 10*3/uL (ref 0.0–0.2)
Basos: 1 %
EOS (ABSOLUTE): 0.3 10*3/uL (ref 0.0–0.4)
Eos: 4 %
Hematocrit: 40.3 % (ref 37.5–51.0)
Hemoglobin: 13.1 g/dL (ref 13.0–17.7)
Immature Grans (Abs): 0 10*3/uL (ref 0.0–0.1)
Immature Granulocytes: 0 %
Lymphocytes Absolute: 2.6 10*3/uL (ref 0.7–3.1)
Lymphs: 41 %
MCH: 31.3 pg (ref 26.6–33.0)
MCHC: 32.5 g/dL (ref 31.5–35.7)
MCV: 96 fL (ref 79–97)
Monocytes Absolute: 0.6 10*3/uL (ref 0.1–0.9)
Monocytes: 10 %
Neutrophils Absolute: 2.8 10*3/uL (ref 1.4–7.0)
Neutrophils: 44 %
Platelets: 179 10*3/uL (ref 150–450)
RBC: 4.18 x10E6/uL (ref 4.14–5.80)
RDW: 12.9 % (ref 11.6–15.4)
WBC: 6.3 10*3/uL (ref 3.4–10.8)

## 2024-01-13 LAB — LIPID PANEL
Cholesterol, Total: 110 mg/dL (ref 100–199)
HDL: 48 mg/dL (ref >39)
LDL CALC COMMENT:: 2.3 ratio (ref 0.0–5.0)
LDL Chol Calc (NIH): 48 mg/dL (ref 0–99)
Triglycerides: 62 mg/dL (ref 0–149)
VLDL Cholesterol Cal: 14 mg/dL (ref 5–40)

## 2024-01-13 LAB — VITAMIN B12: Vitamin B-12: 290 pg/mL (ref 232–1245)

## 2024-01-14 LAB — TSH+FREE T4
Free T4: 1.6 ng/dL (ref 0.82–1.77)
TSH: 0.191 u[IU]/mL — ABNORMAL LOW (ref 0.450–4.500)

## 2024-01-14 LAB — SPECIMEN STATUS REPORT

## 2024-01-15 NOTE — Progress Notes (Signed)
Hello Arthur Franklin,  Your lab result is normal and/or stable.Some minor variations that are not significant are commonly marked abnormal, but do not represent any medical problem for you.  Best regards, Kaimana Neuzil, M.D.

## 2024-02-08 ENCOUNTER — Other Ambulatory Visit: Payer: Self-pay | Admitting: Family Medicine

## 2024-02-08 ENCOUNTER — Ambulatory Visit: Payer: Self-pay

## 2024-02-08 NOTE — Telephone Encounter (Signed)
 Tried calling pt x3. Numnber busy all three times.  Pt will need to be seen for gout flare.  He is requesting Diclofenac  because it has helped in the past.  Diclofenac  has not been prescribed since 2018.

## 2024-02-08 NOTE — Telephone Encounter (Signed)
 FYI Only or Action Required?: Action required by provider: medication refill request.  Patient was last seen in primary care on 01/12/2024 by Zollie Lowers, MD. Called Nurse Triage reporting Gout. Symptoms began several days ago. Interventions attempted: Rest, hydration, or home remedies. Symptoms are: stable.  Triage Disposition: See PCP When Office is Open (Within 3 Days)  Patient/caregiver understands and will follow disposition?: No, wishes to speak with PCP- This RN advised that an appt is needed. Pt is refusing dispo at this time, but is requesting prescription for Diclofenac , says it helped in the past. Phar marcy confirm. Advised patient that he may be contacted provider requires an appt. Pt verbalized understanding      Copied from CRM [id number]. Topic: Clinical - Red Word Triage >> Feb 08, 2024  9:17 AM Graeme ORN wrote: Red Word that prompted transfer to Nurse Triage: Gout infection in hand and wrist - swollen - can't hardly move fingers Reason for Disposition  [1] MODERATE pain (e.g., interferes with normal activities) AND [2] present > 3 days  Answer Assessment - Initial Assessment Questions 1. ONSET: When did the pain start?     Over the weekend  2. LOCATION: Where is the pain located?     Right hand and wrist  3. PAIN: How bad is the pain? (Scale 1-10; or mild, moderate, severe)   - MILD (1-3): doesn't interfere with normal activities   - MODERATE (4-7): interferes with normal activities (e.g., work or school) or awakens from sleep   - SEVERE (8-10): excruciating pain, unable to use hand at all     Moderate  4. WORK OR EXERCISE: Has there been any recent work or exercise that involved this part (i.e., hand or wrist) of the body?     No  5. CAUSE: What do you think is causing the pain?     Was eating tomatoes over the weekend  6. AGGRAVATING FACTORS: What makes the pain worse? (e.g., using computer)     Nothing  7. OTHER SYMPTOMS: Do you have any other  symptoms? (e.g., neck pain, swelling, rash, numbness, fever)     Pain and swelling  Protocols used: Hand and Wrist Pain-A-AH

## 2024-02-09 DIAGNOSIS — L570 Actinic keratosis: Secondary | ICD-10-CM | POA: Diagnosis not present

## 2024-02-09 DIAGNOSIS — L573 Poikiloderma of Civatte: Secondary | ICD-10-CM | POA: Diagnosis not present

## 2024-02-09 DIAGNOSIS — L821 Other seborrheic keratosis: Secondary | ICD-10-CM | POA: Diagnosis not present

## 2024-02-09 DIAGNOSIS — Z85828 Personal history of other malignant neoplasm of skin: Secondary | ICD-10-CM | POA: Diagnosis not present

## 2024-03-04 NOTE — Telephone Encounter (Signed)
Called and lmom to schedule appt

## 2024-04-14 ENCOUNTER — Ambulatory Visit: Payer: Self-pay

## 2024-04-14 NOTE — Telephone Encounter (Signed)
 FYI Only or Action Required?: FYI only for provider.  Patient was last seen in primary care on 01/12/2024 by Zollie Lowers, MD.  Called Nurse Triage reporting Blister.  Symptoms began several weeks ago.  Interventions attempted: Nothing.  Symptoms are: unchanged.  Triage Disposition: See PCP When Office is Open (Within 3 Days)  Patient/caregiver understands and will follow disposition?: Yes    eet blisters/bumps on legs.. constipation  Reason for Disposition  [1] Cause unknown AND [2] no new blisters  Answer Assessment - Initial Assessment Questions 1. APPEARANCE of BLISTER: What does it look like?     Several weeks/months ago,  2. SIZE: How large is the blister? (e.g., inches, cm or compare to coins)     little 3. LOCATION: Where are the blisters located?      feet 4. WHEN: When did the blister happen?     Several weeks/months ago 5. CAUSE: What do you think caused the blister?     unknown 6. PAIN: Does it hurt? If Yes, ask: How bad is the pain?  (Scale 0-10; or none, mild, moderate, severe)     denies 7. OTHER SYMPTOMS: Do you have any other symptoms? (e.g., fever)     itching  Protocols used: Blister - Foot and Hand-A-AH

## 2024-04-15 ENCOUNTER — Ambulatory Visit (INDEPENDENT_AMBULATORY_CARE_PROVIDER_SITE_OTHER): Admitting: Family Medicine

## 2024-04-15 ENCOUNTER — Ambulatory Visit: Payer: Self-pay | Admitting: Family Medicine

## 2024-04-15 ENCOUNTER — Encounter: Payer: Self-pay | Admitting: Family Medicine

## 2024-04-15 VITALS — BP 130/68 | HR 55 | Temp 98.8°F | Ht 69.0 in | Wt 136.0 lb

## 2024-04-15 DIAGNOSIS — R3912 Poor urinary stream: Secondary | ICD-10-CM | POA: Diagnosis not present

## 2024-04-15 DIAGNOSIS — L2989 Other pruritus: Secondary | ICD-10-CM

## 2024-04-15 DIAGNOSIS — B880 Other acariasis: Secondary | ICD-10-CM | POA: Diagnosis not present

## 2024-04-15 LAB — MICROSCOPIC EXAMINATION
Bacteria, UA: NONE SEEN
Renal Epithel, UA: NONE SEEN /HPF
Yeast, UA: NONE SEEN

## 2024-04-15 LAB — URINALYSIS, ROUTINE W REFLEX MICROSCOPIC
Bilirubin, UA: NEGATIVE
Glucose, UA: NEGATIVE
Leukocytes,UA: NEGATIVE
Nitrite, UA: NEGATIVE
RBC, UA: NEGATIVE
Specific Gravity, UA: 1.02 (ref 1.005–1.030)
Urobilinogen, Ur: 1 mg/dL (ref 0.2–1.0)
pH, UA: 6.5 (ref 5.0–7.5)

## 2024-04-15 MED ORDER — TRIAMCINOLONE ACETONIDE 0.1 % EX CREA: 1.0000 | TOPICAL_CREAM | Freq: Two times a day (BID) | CUTANEOUS | 0 refills | Status: AC

## 2024-04-15 NOTE — Progress Notes (Signed)
 Subjective:  Patient ID: Arthur Franklin, male    DOB: 06-Jul-1932, 89 y.o.   MRN: [id number]  Patient Care Team: Zollie Lowers, MD as PCP - General (Family Medicine)   Chief Complaint:  blisters bilateral legs feet (Possible insect bites/Patient says some spots bubbled up with fluid)   HPI: Arthur Franklin is a 89 y.o. male presenting on 04/15/2024 for blisters bilateral legs feet (Possible insect bites/Patient says some spots bubbled up with fluid)   Arthur Franklin is an 89 year old male who presents with skin lesions and a weak urinary stream.  He has blisters and sores on his feet, attributed to exposure to weeds. The lesions are described as 'little papules' that are 'real red', and they itch and hurt slightly, especially when they start to fall. He has been treating them at home by applying rubbing alcohol nightly, but this has not provided sufficient relief. He recalls having similar issues in the past when spending time in the yard and woods, particularly when hunting blackbirds.  He also reports a weak urinary stream, which he associates with a history of prostate issues. He recalls being prescribed an antibiotic in the past, possibly for prostatitis or a prostate infection, but is unsure of the details. He has had urine cultures in the past.        Relevant past medical, surgical, family, and social history reviewed and updated as indicated.  Allergies and medications reviewed and updated. Data reviewed: Chart in Epic.   Past Medical History:  Diagnosis Date   Diabetes mellitus without complication (HCC)    Hyperlipidemia    Thyroid  disease     History reviewed. No pertinent surgical history.  Social History   Socioeconomic History   Marital status: Married    Spouse name: Leonor   Number of children: 1   Years of education: Not on file   Highest education level: Not on file  Occupational History   Occupation: retired  Tobacco Use   Smoking status: Never    Smokeless tobacco: Never  Substance and Sexual Activity   Alcohol use: No   Drug use: No   Sexual activity: Not on file  Other Topics Concern   Not on file  Social History Narrative   One daughter, one grandchild - they live nearby   Social Drivers of Corporate investment banker Strain: Low Risk  (12/02/2023)   Overall Financial Resource Strain (CARDIA)    Difficulty of Paying Living Expenses: Not hard at all  Food Insecurity: No Food Insecurity (12/02/2023)   Hunger Vital Sign    Worried About Running Out of Food in the Last Year: Never true    Ran Out of Food in the Last Year: Never true  Transportation Needs: No Transportation Needs (12/02/2023)   PRAPARE - Administrator, Civil Service (Medical): No    Lack of Transportation (Non-Medical): No  Physical Activity: Insufficiently Active (12/02/2023)   Exercise Vital Sign    Days of Exercise per Week: 3 days    Minutes of Exercise per Session: 30 min  Stress: No Stress Concern Present (12/02/2023)   [employer] of Occupational Health - Occupational Stress Questionnaire    Feeling of Stress : Not at all  Social Connections: Moderately Integrated (12/02/2023)   Social Connection and Isolation Panel    Frequency of Communication with Friends and Family: More than three times a week    Frequency of Social Gatherings with Friends and Family:  More than three times a week    Attends Religious Services: More than 4 times per year    Active Member of Clubs or Organizations: No    Attends Banker Meetings: Never    Marital Status: Married  Catering manager Violence: Not At Risk (12/02/2023)   Humiliation, Afraid, Rape, and Kick questionnaire    Fear of Current or Ex-Partner: No    Emotionally Abused: No    Physically Abused: No    Sexually Abused: No    Outpatient Encounter Medications as of 04/15/2024  Medication Sig   triamcinolone  cream (KENALOG ) 0.1 % Apply 1 Application topically 2 (two) times daily.    atorvastatin  (LIPITOR) 40 MG tablet Take 1 tablet (40 mg total) by mouth daily.   levothyroxine  (SYNTHROID ) 88 MCG tablet Take 1 tablet (88 mcg total) by mouth daily before breakfast.   No facility-administered encounter medications on file as of 04/15/2024.    No Known Allergies  Pertinent ROS per HPI, otherwise unremarkable      Objective:  BP 130/68   Pulse (!) 55   Temp 98.8 F (37.1 C)   Ht 5' 9 (1.753 m)   Wt 136 lb (61.7 kg)   SpO2 96%   BMI 20.08 kg/m    Wt Readings from Last 3 Encounters:  04/15/24 136 lb (61.7 kg)  01/12/24 136 lb (61.7 kg)  12/02/23 136 lb (61.7 kg)    Physical Exam Vitals and nursing note reviewed.  Constitutional:      General: He is not in acute distress.    Appearance: Normal appearance. He is not ill-appearing, toxic-appearing or diaphoretic.  HENT:     Head: Normocephalic and atraumatic.     Mouth/Throat:     Mouth: Mucous membranes are moist.  Eyes:     Pupils: Pupils are equal, round, and reactive to light.  Cardiovascular:     Rate and Rhythm: Normal rate and regular rhythm.     Heart sounds: Normal heart sounds.  Pulmonary:     Effort: Pulmonary effort is normal.     Breath sounds: Normal breath sounds.  Musculoskeletal:     Cervical back: Neck supple.     Right lower leg: No edema.     Left lower leg: No edema.  Skin:    General: Skin is warm and dry.     Capillary Refill: Capillary refill takes less than 2 seconds.     Findings: Rash (Multiple small erythematous papules are present on the lower leg) present.  Neurological:     General: No focal deficit present.     Mental Status: He is alert and oriented to person, place, and time.  Psychiatric:        Mood and Affect: Mood normal.        Thought Content: Thought content normal.        Judgment: Judgment normal.     Results for orders placed or performed in visit on 04/15/24  Microscopic Examination   Collection Time: 04/15/24  2:45 PM   Urine  Result Value  Ref Range   WBC, UA 0-5 0 - 5 /hpf   RBC, Urine 0-2 0 - 2 /hpf   Epithelial Cells (non renal) 0-10 0 - 10 /hpf   Renal Epithel, UA None seen None seen /hpf   Bacteria, UA None seen None seen/Few   Yeast, UA None seen None seen  Urinalysis, Routine w reflex microscopic   Collection Time: 04/15/24  2:45 PM  Result Value  Ref Range   Specific Gravity, UA 1.020 1.005 - 1.030   pH, UA 6.5 5.0 - 7.5   Color, UA Yellow Yellow   Appearance Ur Clear Clear   Leukocytes,UA Negative Negative   Protein,UA 1+ (A) Negative/Trace   Glucose, UA Negative Negative   Ketones, UA Trace (A) Negative   RBC, UA Negative Negative   Bilirubin, UA Negative Negative   Urobilinogen, Ur 1.0 0.2 - 1.0 mg/dL   Nitrite, UA Negative Negative   Microscopic Examination See below:        Pertinent labs & imaging results that were available during my care of the patient were reviewed by me and considered in my medical decision making.  Assessment & Plan:  Eutimio was seen today for blisters bilateral legs feet.  Diagnoses and all orders for this visit:  Chiggers -     triamcinolone  cream (KENALOG ) 0.1 %; Apply 1 Application topically 2 (two) times daily.  Pruritic erythematous rash -     triamcinolone  cream (KENALOG ) 0.1 %; Apply 1 Application topically 2 (two) times daily.  Weak urinary stream -     CBC with Differential/Platelet -     PSA, total and free -     Urinalysis, Routine w reflex microscopic -     Urine Culture -     Microscopic Examination     Chigger bites with pruritic skin eruptions Pruritic skin eruptions consistent with chigger bites, characterized by scattered red papules and water blisters, likely acquired from outdoor exposure in areas with weeds and grass. - Prescribe triamcinolone  cream to apply twice daily to affected areas to reduce redness and itching. - Provide instructions for a home remedy mix of ammonia, baking soda, and meat tenderizer for symptomatic relief. - Advise on the  use of calamine lotion as an additional treatment option.  Weak urinary stream, possible prostatic enlargement Weak urinary stream possibly due to prostatic enlargement. Differential includes urinary tract infection or prostatic enlargement. - Order blood work including PSA to assess for prostatic abnormalities. - Obtain urine sample for analysis and culture to check for infection. - If urine culture indicates infection, consider prescribing antibiotics such as ciprofloxacin .          Continue all other maintenance medications.  Follow up plan: Return if symptoms worsen or fail to improve.   Continue healthy lifestyle choices, including diet (rich in fruits, vegetables, and lean proteins, and low in salt and simple carbohydrates) and exercise (at least 30 minutes of moderate physical activity daily).  Chiggers information provided.   The above assessment and management plan was discussed with the patient. The patient verbalized understanding of and has agreed to the management plan. Patient is aware to call the clinic if they develop any new symptoms or if symptoms persist or worsen. Patient is aware when to return to the clinic for a follow-up visit. Patient educated on when it is appropriate to go to the emergency department.   Rosaline Bruns, FNP-C [facility] [phone]

## 2024-04-15 NOTE — Patient Instructions (Signed)
1 cup ammonia 1 tsp baking soda 1 tsp meat tenderizer *Mix together and dab on bites  

## 2024-04-16 LAB — CBC WITH DIFFERENTIAL/PLATELET
Basophils Absolute: 0.1 x10E3/uL (ref 0.0–0.2)
Basos: 1 %
EOS (ABSOLUTE): 0.4 x10E3/uL (ref 0.0–0.4)
Eos: 5 %
Hematocrit: 44 % (ref 37.5–51.0)
Hemoglobin: 14.3 g/dL (ref 13.0–17.7)
Immature Grans (Abs): 0 x10E3/uL (ref 0.0–0.1)
Immature Granulocytes: 0 %
Lymphocytes Absolute: 3.2 x10E3/uL — ABNORMAL HIGH (ref 0.7–3.1)
Lymphs: 45 %
MCH: 31.5 pg (ref 26.6–33.0)
MCHC: 32.5 g/dL (ref 31.5–35.7)
MCV: 97 fL (ref 79–97)
Monocytes Absolute: 0.7 x10E3/uL (ref 0.1–0.9)
Monocytes: 10 %
Neutrophils Absolute: 2.9 x10E3/uL (ref 1.4–7.0)
Neutrophils: 39 %
Platelets: 197 x10E3/uL (ref 150–450)
RBC: 4.54 x10E6/uL (ref 4.14–5.80)
RDW: 13 % (ref 11.6–15.4)
WBC: 7.3 x10E3/uL (ref 3.4–10.8)

## 2024-04-16 LAB — PSA, TOTAL AND FREE
PSA, Free Pct: 30.7 %
PSA, Free: 0.43 ng/mL
Prostate Specific Ag, Serum: 1.4 ng/mL (ref 0.0–4.0)

## 2024-04-17 LAB — URINE CULTURE

## 2024-04-19 NOTE — Telephone Encounter (Signed)
 Left detailed message making patient aware of negative lab results and advised for him to call the office if he had additional questions/concerns.

## 2024-07-12 ENCOUNTER — Ambulatory Visit (INDEPENDENT_AMBULATORY_CARE_PROVIDER_SITE_OTHER): Payer: Self-pay | Admitting: Family Medicine

## 2024-07-12 ENCOUNTER — Encounter: Payer: Self-pay | Admitting: Family Medicine

## 2024-07-12 VITALS — BP 145/63 | HR 52 | Temp 97.8°F | Ht 69.0 in | Wt 138.0 lb

## 2024-07-12 DIAGNOSIS — E782 Mixed hyperlipidemia: Secondary | ICD-10-CM | POA: Diagnosis not present

## 2024-07-12 DIAGNOSIS — E039 Hypothyroidism, unspecified: Secondary | ICD-10-CM | POA: Diagnosis not present

## 2024-07-12 DIAGNOSIS — E1169 Type 2 diabetes mellitus with other specified complication: Secondary | ICD-10-CM

## 2024-07-12 DIAGNOSIS — E119 Type 2 diabetes mellitus without complications: Secondary | ICD-10-CM | POA: Diagnosis not present

## 2024-07-12 LAB — BAYER DCA HB A1C WAIVED: HB A1C (BAYER DCA - WAIVED): 5.9 % — ABNORMAL HIGH (ref 4.8–5.6)

## 2024-07-12 NOTE — Progress Notes (Signed)
 Subjective:  Patient ID: Arthur Franklin, male    DOB: 01-Jan-1932  Age: 89 y.o. MRN: [id number]  CC: Medical Management of Chronic Issues   HPI  Discussed the use of AI scribe software for clinical note transcription with the patient, who gave verbal consent to proceed.  History of Present Illness     follow-up on  thyroid . The patient has a history of hypothyroidism for many years. It has been stable recently. Pt. denies any change in  voice, loss of hair, heat or cold intolerance. Energy level has been adequate to good. Patient denies constipation and diarrhea. No myxedema. Medication is as noted below. Verified that pt is taking it daily on an empty stomach. Well tolerated.  presents forFollow-up of diabetes.  Patient denies symptoms such as polyuria, polydipsia, excessive hunger, nausea No significant hypoglycemic spells noted. Medications reviewed. Pt reports taking them regularly without complication/adverse reaction being reported today.  Lab Results  Component Value Date   HGBA1C 5.9 (H) 07/12/2024   HGBA1C 5.6 01/12/2024   HGBA1C 5.9 (H) 07/13/2023      in for follow-up of elevated cholesterol. Doing well without complaints on current medication. Denies side effects of statin including myalgia and arthralgia and nausea. Currently no chest pain, shortness of breath or other cardiovascular related symptoms noted.       04/15/2024    2:26 PM 01/12/2024    1:51 PM 12/02/2023    3:03 PM  Depression screen PHQ 2/9  Decreased Interest 0 0 0  Down, Depressed, Hopeless 0 0 0  PHQ - 2 Score 0 0 0  Altered sleeping 0    Tired, decreased energy 1    Change in appetite 0    Feeling bad or failure about yourself  0    Trouble concentrating 0    Moving slowly or fidgety/restless 0    Suicidal thoughts 0    PHQ-9 Score 1        Data saved with a previous flowsheet row definition    History Arthur Franklin has a past medical history of Diabetes mellitus without complication (HCC),  Hyperlipidemia, and Thyroid  disease.   He has no past surgical history on file.   His family history is not on file.He reports that he has never smoked. He has never used smokeless tobacco. He reports that he does not drink alcohol and does not use drugs.    ROS Review of Systems  Constitutional: Negative.   HENT: Negative.    Eyes:  Negative for visual disturbance.  Respiratory:  Negative for cough and shortness of breath.   Cardiovascular:  Negative for chest pain and leg swelling.  Gastrointestinal:  Negative for abdominal pain, diarrhea, nausea and vomiting.  Genitourinary:  Negative for difficulty urinating.  Musculoskeletal:  Negative for arthralgias and myalgias.  Skin:  Negative for rash.  Neurological:  Negative for headaches.  Psychiatric/Behavioral:  Negative for sleep disturbance.     Objective:  BP (!) 145/63   Pulse (!) 52   Temp 97.8 F (36.6 C)   Ht 5' 9 (1.753 m)   Wt 138 lb (62.6 kg)   SpO2 98%   BMI 20.38 kg/m   BP Readings from Last 3 Encounters:  07/12/24 (!) 145/63  04/15/24 130/68  01/12/24 (!) 148/65    Wt Readings from Last 3 Encounters:  07/12/24 138 lb (62.6 kg)  04/15/24 136 lb (61.7 kg)  01/12/24 136 lb (61.7 kg)     Physical Exam Vitals reviewed.  Constitutional:  Appearance: He is well-developed.  HENT:     Head: Normocephalic and atraumatic.     Right Ear: External ear normal.     Left Ear: External ear normal.     Mouth/Throat:     Pharynx: No oropharyngeal exudate or posterior oropharyngeal erythema.  Eyes:     Pupils: Pupils are equal, round, and reactive to light.  Cardiovascular:     Rate and Rhythm: Normal rate and regular rhythm.     Heart sounds: No murmur heard. Pulmonary:     Effort: No respiratory distress.     Breath sounds: Normal breath sounds.  Musculoskeletal:     Cervical back: Normal range of motion and neck supple.  Neurological:     Mental Status: He is alert and oriented to person, place, and  time.    Physical Exam    Assessment & Plan:  Mixed hyperlipidemia -     CMP14+EGFR -     Lipid panel  Type 2 diabetes mellitus without complication, without long-term current use of insulin (HCC) -     Bayer DCA Hb A1c Waived -     CBC with Differential/Platelet -     CMP14+EGFR -     Lipid panel -     Microalbumin / creatinine urine ratio -     TSH + free T4  Hypothyroidism, unspecified type -     CBC with Differential/Platelet -     CMP14+EGFR -     TSH + free T4    Assessment & Plan    Follow-up: Return in about 6 months (around 01/09/2025) for diabetes, Hypothyroidism, cholesterol.  Butler Der, M.D.

## 2024-07-13 ENCOUNTER — Ambulatory Visit: Payer: Self-pay | Admitting: Family Medicine

## 2024-07-13 LAB — CMP14+EGFR
ALT: 23 IU/L (ref 0–44)
AST: 29 IU/L (ref 0–40)
Albumin: 4.3 g/dL (ref 3.6–4.6)
Alkaline Phosphatase: 95 IU/L (ref 48–129)
BUN/Creatinine Ratio: 17 (ref 10–24)
BUN: 17 mg/dL (ref 10–36)
Bilirubin Total: 0.3 mg/dL (ref 0.0–1.2)
CO2: 24 mmol/L (ref 20–29)
Calcium: 9.6 mg/dL (ref 8.6–10.2)
Chloride: 104 mmol/L (ref 96–106)
Creatinine, Ser: 1.02 mg/dL (ref 0.76–1.27)
Globulin, Total: 2.4 g/dL (ref 1.5–4.5)
Glucose: 130 mg/dL — ABNORMAL HIGH (ref 70–99)
Potassium: 4.3 mmol/L (ref 3.5–5.2)
Sodium: 142 mmol/L (ref 134–144)
Total Protein: 6.7 g/dL (ref 6.0–8.5)
eGFR: 70 mL/min/1.73 (ref >59)

## 2024-07-13 LAB — MICROALBUMIN / CREATININE URINE RATIO
Creatinine, Urine: 149.8 mg/dL
Microalb/Creat Ratio: 13 mg/g{creat} (ref 0–29)
Microalbumin, Urine: 18.9 ug/mL

## 2024-07-13 LAB — CBC WITH DIFFERENTIAL/PLATELET
Basophils Absolute: 0.1 x10E3/uL (ref 0.0–0.2)
Basos: 1 %
EOS (ABSOLUTE): 0.5 x10E3/uL — ABNORMAL HIGH (ref 0.0–0.4)
Eos: 7 %
Hematocrit: 41.6 % (ref 37.5–51.0)
Hemoglobin: 13.7 g/dL (ref 13.0–17.7)
Immature Grans (Abs): 0 x10E3/uL (ref 0.0–0.1)
Immature Granulocytes: 0 %
Lymphocytes Absolute: 2.3 x10E3/uL (ref 0.7–3.1)
Lymphs: 37 %
MCH: 31.6 pg (ref 26.6–33.0)
MCHC: 32.9 g/dL (ref 31.5–35.7)
MCV: 96 fL (ref 79–97)
Monocytes Absolute: 0.6 x10E3/uL (ref 0.1–0.9)
Monocytes: 10 %
Neutrophils Absolute: 2.7 x10E3/uL (ref 1.4–7.0)
Neutrophils: 45 %
Platelets: 199 x10E3/uL (ref 150–450)
RBC: 4.33 x10E6/uL (ref 4.14–5.80)
RDW: 12.5 % (ref 11.6–15.4)
WBC: 6.1 x10E3/uL (ref 3.4–10.8)

## 2024-07-13 LAB — LIPID PANEL
Chol/HDL Ratio: 2.3 ratio (ref 0.0–5.0)
Cholesterol, Total: 117 mg/dL (ref 100–199)
HDL: 51 mg/dL (ref >39)
LDL Chol Calc (NIH): 53 mg/dL (ref 0–99)
Triglycerides: 61 mg/dL (ref 0–149)
VLDL Cholesterol Cal: 13 mg/dL (ref 5–40)

## 2024-07-13 LAB — TSH+FREE T4
Free T4: 1.27 ng/dL (ref 0.82–1.77)
TSH: 1.19 u[IU]/mL (ref 0.450–4.500)

## 2024-07-13 NOTE — Progress Notes (Signed)
Hello Nickolaos,  Your lab result is normal and/or stable.Some minor variations that are not significant are commonly marked abnormal, but do not represent any medical problem for you.  Best regards, Kaimana Neuzil, M.D.

## 2024-07-15 ENCOUNTER — Other Ambulatory Visit: Payer: Self-pay | Admitting: Family Medicine

## 2024-07-15 DIAGNOSIS — E782 Mixed hyperlipidemia: Secondary | ICD-10-CM

## 2024-08-18 ENCOUNTER — Other Ambulatory Visit: Payer: Self-pay | Admitting: Family Medicine

## 2024-08-18 DIAGNOSIS — E039 Hypothyroidism, unspecified: Secondary | ICD-10-CM

## 2024-12-02 ENCOUNTER — Ambulatory Visit: Payer: Self-pay

## 2025-01-10 ENCOUNTER — Ambulatory Visit: Admitting: Family Medicine
